# Patient Record
Sex: Male | Born: 1990 | Race: Black or African American | Hispanic: No | Marital: Married | Smoking: Current every day smoker
Health system: Southern US, Community
[De-identification: ages and names within clinical notes are randomized; demographics above are authoritative.]

---

## 2008-12-17 HISTORY — PX: KNEE SURGERY: SHX244

## 2014-08-03 ENCOUNTER — Encounter (HOSPITAL_COMMUNITY): Payer: Self-pay | Admitting: Emergency Medicine

## 2014-08-03 ENCOUNTER — Emergency Department (HOSPITAL_COMMUNITY)
Admission: EM | Admit: 2014-08-03 | Discharge: 2014-08-03 | Disposition: A | Discharge status: Home / Self Care | Payer: Self-pay | Attending: Emergency Medicine | Admitting: Emergency Medicine

## 2014-08-03 ENCOUNTER — Emergency Department (HOSPITAL_COMMUNITY): Payer: Self-pay

## 2014-08-03 DIAGNOSIS — S99919A Unspecified injury of unspecified ankle, initial encounter: Principal | ICD-10-CM

## 2014-08-03 DIAGNOSIS — X500XXA Overexertion from strenuous movement or load, initial encounter: Secondary | ICD-10-CM | POA: Insufficient documentation

## 2014-08-03 DIAGNOSIS — Y9289 Other specified places as the place of occurrence of the external cause: Secondary | ICD-10-CM | POA: Insufficient documentation

## 2014-08-03 DIAGNOSIS — S99929A Unspecified injury of unspecified foot, initial encounter: Principal | ICD-10-CM

## 2014-08-03 DIAGNOSIS — F17200 Nicotine dependence, unspecified, uncomplicated: Secondary | ICD-10-CM | POA: Insufficient documentation

## 2014-08-03 DIAGNOSIS — M25562 Pain in left knee: Secondary | ICD-10-CM

## 2014-08-03 DIAGNOSIS — S8990XA Unspecified injury of unspecified lower leg, initial encounter: Secondary | ICD-10-CM | POA: Insufficient documentation

## 2014-08-03 DIAGNOSIS — Y9389 Activity, other specified: Secondary | ICD-10-CM | POA: Insufficient documentation

## 2014-08-03 MED ORDER — NAPROXEN 375 MG PO TABS: 375.0000 mg | ORAL_TABLET | Freq: Two times a day (BID) | ORAL | Status: DC

## 2014-08-03 NOTE — Discharge Instructions (Signed)
Knee Bracing °Knee braces are supports to help stabilize and protect an injured or painful knee. They come in many different styles. They should support and protect the knee without increasing the chance of other injuries to yourself or others. It is important not to have a false sense of security when using a brace. Knee braces that help you to keep using your knee: °· Do not restore normal knee stability under high stress forces. °· May decrease some aspects of athletic performance. °Some of the different types of knee braces are: °· Prophylactic knee braces are designed to prevent or reduce the severity of knee injuries during sports that make injury to the knee more likely. °· Rehabilitative knee braces are designed to allow protected motion of: °¨ Injured knees. °¨ Knees that have been treated with or without surgery. °There is no evidence that the use of a supportive knee brace protects the graft following a successful anterior cruciate ligament (ACL) reconstruction. However, braces are sometimes used to:  °· Protect injured ligaments. °· Control knee movement during the initial healing period. °They may be used as part of the treatment program for the various injured ligaments or cartilage of the knee including the: °· Anterior cruciate ligament. °· Medial collateral ligament. °· Medial or lateral cartilage (meniscus). °· Posterior cruciate ligament. °· Lateral collateral ligament. °Rehabilitative knee braces are most commonly used: °· During crutch-assisted walking right after injury. °· During crutch-assisted walking right after surgery to repair the cartilage and/or cruciate ligament injury. °· For a short period of time, 2-8 weeks, after the injury or surgery. °The value of a rehabilitative brace as opposed to a cast or splint includes the: °· Ability to adjust the brace for swelling. °· Ability to remove the brace for examinations, icing, or showering. °· Ability to allow for movement in a controlled  range of motion. °Functional knee braces give support to knees that have already been injured. They are designed to provide stability for the injured knee and provide protection after repair. Functional knee braces may not affect performance much. Lower extremity muscle strengthening, flexibility, and improvement in technique are more important than bracing in treating ligamentous knee injuries. Functional braces are not a substitute for rehabilitation or surgical procedures. °Unloader/off-loader braces are designed to provide pain relief in arthritic knees. Patients with wear and tear arthritis from growing old or from an old cartilage injury (osteoarthritis) of the knee, and bowlegged (varus) or knock-knee (valgus) deformities, often develop increased pain in the arthritic side due to increased loading. Unloader/off-loader braces are made to reduce uneven loading in such knees. There is reduction in bowing out movement in bowlegged knees when the correct unloader brace is used. Patients with advanced osteoarthritis or severe varus or valgus alignment problems would not likely benefit from bracing. °Patellofemoral braces help the kneecap to move smoothly and well centered over the end of the femur in the knee.  °Most people who wear knee braces feel that they help. However, there is a lack of scientific evidence that knee braces are helpful at the level needed for athletic participation to prevent injury. In spite of this, athletes report an increase in knee stability, pain relief, performance improvement, and confidence during athletics when using a brace.  °Different knee problems require different knee braces: °· Your caregiver may suggest one kind of knee brace after knee surgery. °· A caregiver may choose another kind of knee brace for support instead of surgery for some types of torn ligaments. °· You may   also need one for pain in the front of your knee that is not getting better with strengthening and  flexibility exercises. °Get your caregiver's advice if you want to try a knee brace. The caregiver will advise you on where to get them and provide a prescription when it is needed to fashion and/or fit the brace. °Knee braces are the least important part of preventing knee injuries or getting better following injury. Stretching, strengthening and technique improvement are far more important in caring for and preventing knee injuries. When strengthening your knee, increase your activities a little at a time so as not to develop injuries from overuse. Work out an exercise plan with your caregiver and/or physical therapist to get the best program for you. Do not let a knee brace become a crutch. °Always remember, there are no braces which support the knee as well as your original ligaments and cartilage you were born with. Conditioning, proper warm-up, and stretching remain the most important parts of keeping your knees healthy. °HOW TO USE A KNEE BRACE °· During sports, knee braces should be used as directed by your caregiver. °· Make sure that the hinges are where the knee bends. °· Straps, tapes, or hook-and-loop tapes should be fastened around your leg as instructed. °· You should check the placement of the brace during activities to make sure that it has not moved. Poorly positioned braces can hurt rather than help you. °· To work well, a knee brace should be worn during all activities that put you at risk of knee injury. °· Warm up properly before beginning athletic activities. °HOME CARE INSTRUCTIONS °· Knee braces often get damaged during normal use. Replace worn-out braces for maximum benefit. °· Clean regularly with soap and water. °· Inspect your brace often for wear and tear. °· Cover exposed metal to protect others from injury. °· Durable materials may cost more, but last longer. °SEEK IMMEDIATE MEDICAL CARE IF:  °· Your knee seems to be getting worse rather than better. °· You have increasing pain or  swelling in the knee. °· You have problems caused by the knee brace. °· You have increased swelling or inflammation (redness or soreness) in your knee. °· Your knee becomes warm and more painful and you develop an unexplained temperature over 101°F (38.3°C). °MAKE SURE YOU:  °· Understand these instructions. °· Will watch your condition. °· Will get help right away if you are not doing well or get worse. °See your caregiver, physical therapist, or orthopedic surgeon for additional information. °Document Released: 02/23/2004 Document Revised: 04/19/2014 Document Reviewed: 06/01/2009 °[employer]® Patient Information ©2015 [employer]. This information is not intended to replace advice given to you by your health care provider. Make sure you discuss any questions you have with your health care provider. ° °Knee Pain °The knee is the complex joint between your thigh and your lower leg. It is made up of bones, tendons, ligaments, and cartilage. The bones that make up the knee are: °· The femur in the thigh. °· The tibia and fibula in the lower leg. °· The patella or kneecap riding in the groove on the lower femur. °CAUSES  °Knee pain is a common complaint with many causes. A few of these causes are: °· Injury, such as: °¨ A ruptured ligament or tendon injury. °¨ Torn cartilage. °· Medical conditions, such as: °¨ Gout °¨ Arthritis °¨ Infections °· Overuse, over training, or overdoing a physical activity. °Knee pain can be minor or severe. Knee pain can   accompany debilitating injury. Minor knee problems often respond well to self-care measures or get well on their own. More serious injuries may need medical intervention or even surgery. °SYMPTOMS °The knee is complex. Symptoms of knee problems can vary widely. Some of the problems are: °· Pain with movement and weight bearing. °· Swelling and tenderness. °· Buckling of the knee. °· Inability to straighten or extend your knee. °· Your knee locks and you cannot straighten  it. °· Warmth and redness with pain and fever. °· Deformity or dislocation of the kneecap. °DIAGNOSIS  °Determining what is wrong may be very straight forward such as when there is an injury. It can also be challenging because of the complexity of the knee. Tests to make a diagnosis may include: °· Your caregiver taking a history and doing a physical exam. °· Routine X-rays can be used to rule out other problems. X-rays will not reveal a cartilage tear. Some injuries of the knee can be diagnosed by: °¨ Arthroscopy a surgical technique by which a small video camera is inserted through tiny incisions on the sides of the knee. This procedure is used to examine and repair internal knee joint problems. Tiny instruments can be used during arthroscopy to repair the torn knee cartilage (meniscus). °¨ Arthrography is a radiology technique. A contrast liquid is directly injected into the knee joint. Internal structures of the knee joint then become visible on X-ray film. °¨ An MRI scan is a non X-ray radiology procedure in which magnetic fields and a computer produce two- or three-dimensional images of the inside of the knee. Cartilage tears are often visible using an MRI scanner. MRI scans have largely replaced arthrography in diagnosing cartilage tears of the knee. °· Blood work. °· Examination of the fluid that helps to lubricate the knee joint (synovial fluid). This is done by taking a sample out using a needle and a syringe. °TREATMENT °The treatment of knee problems depends on the cause. Some of these treatments are: °· Depending on the injury, proper casting, splinting, surgery, or physical therapy care will be needed. °· Give yourself adequate recovery time. Do not overuse your joints. If you begin to get sore during workout routines, back off. Slow down or do fewer repetitions. °· For repetitive activities such as cycling or running, maintain your strength and nutrition. °· Alternate muscle groups. For example, if  you are a weight lifter, work the upper body on one day and the lower body the next. °· Either tight or weak muscles do not give the proper support for your knee. Tight or weak muscles do not absorb the stress placed on the knee joint. Keep the muscles surrounding the knee strong. °· Take care of mechanical problems. °¨ If you have flat feet, orthotics or special shoes may help. See your caregiver if you need help. °¨ Arch supports, sometimes with wedges on the inner or outer aspect of the heel, can help. These can shift pressure away from the side of the knee most bothered by osteoarthritis. °¨ A brace called an "unloader" brace also may be used to help ease the pressure on the most arthritic side of the knee. °· If your caregiver has prescribed crutches, braces, wraps or ice, use as directed. The acronym for this is PRICE. This means protection, rest, ice, compression, and elevation. °· Nonsteroidal anti-inflammatory drugs (NSAIDs), can help relieve pain. But if taken immediately after an injury, they may actually increase swelling. Take NSAIDs with food in your stomach. Stop them   if you develop stomach problems. Do not take these if you have a history of ulcers, stomach pain, or bleeding from the bowel. Do not take without your caregiver's approval if you have problems with fluid retention, heart failure, or kidney problems. °· For ongoing knee problems, physical therapy may be helpful. °· Glucosamine and chondroitin are over-the-counter dietary supplements. Both may help relieve the pain of osteoarthritis in the knee. These medicines are different from the usual anti-inflammatory drugs. Glucosamine may decrease the rate of cartilage destruction. °· Injections of a corticosteroid drug into your knee joint may help reduce the symptoms of an arthritis flare-up. They may provide pain relief that lasts a few months. You may have to wait a few months between injections. The injections do have a small increased risk of  infection, water retention, and elevated blood sugar levels. °· Hyaluronic acid injected into damaged joints may ease pain and provide lubrication. These injections may work by reducing inflammation. A series of shots may give relief for as long as 6 months. °· Topical painkillers. Applying certain ointments to your skin may help relieve the pain and stiffness of osteoarthritis. Ask your pharmacist for suggestions. Many over the-counter products are approved for temporary relief of arthritis pain. °· In some countries, doctors often prescribe topical NSAIDs for relief of chronic conditions such as arthritis and tendinitis. A review of treatment with NSAID creams found that they worked as well as oral medications but without the serious side effects. °PREVENTION °· Maintain a healthy weight. Extra pounds put more strain on your joints. °· Get strong, stay limber. Weak muscles are a common cause of knee injuries. Stretching is important. Include flexibility exercises in your workouts. °· Be smart about exercise. If you have osteoarthritis, chronic knee pain or recurring injuries, you may need to change the way you exercise. This does not mean you have to stop being active. If your knees ache after jogging or playing basketball, consider switching to swimming, water aerobics, or other low-impact activities, at least for a few days a week. Sometimes limiting high-impact activities will provide relief. °· Make sure your shoes fit well. Choose footwear that is right for your sport. °· Protect your knees. Use the proper gear for knee-sensitive activities. Use kneepads when playing volleyball or laying carpet. Buckle your seat belt every time you drive. Most shattered kneecaps occur in car accidents. °· Rest when you are tired. °SEEK MEDICAL CARE IF:  °You have knee pain that is continual and does not seem to be getting better.  °SEEK IMMEDIATE MEDICAL CARE IF:  °Your knee joint feels hot to the touch and you have a high  fever. °MAKE SURE YOU:  °· Understand these instructions. °· Will watch your condition. °· Will get help right away if you are not doing well or get worse. °Document Released: 09/30/2007 Document Revised: 02/25/2012 Document Reviewed: 09/30/2007 °[employer]® Patient Information ©2015 [employer]. This information is not intended to replace advice given to you by your health care provider. Make sure you discuss any questions you have with your health care provider. ° °

## 2014-08-03 NOTE — ED Notes (Signed)
Left knee pain after stretching yesterday

## 2014-08-03 NOTE — ED Provider Notes (Signed)
CSN: [id number]     Arrival date & time 08/03/14  1102 History   First MD Initiated Contact with Patient 08/03/14 1145     Chief Complaint  Patient presents with  . Knee Pain     (Consider location/radiation/quality/duration/timing/severity/associated sxs/prior Treatment) HPI  Patient to the ER with complaints of left knee pain that started yesterday. He reports stretching his hip and then feeling his left knee pop and since then he has had pain. He is able to walk but with pain. He is wearing a knee sleeve from home and has taken Aleve. He has a history of MCL tear to his right knee and is concerned that he may have done so to the left side. Denies fall, head injury.  History reviewed. No pertinent past medical history. No past surgical history on file. No family history on file. History  Substance Use Topics  . Smoking status: Current Every Day Smoker  . Smokeless tobacco: Not on file  . Alcohol Use: Yes    Review of Systems   Review of Systems  Gen: no weight loss, fevers, chills, night sweats  Eyes: no occular draining, occular pain,  No visual changes  Nose: no epistaxis or rhinorrhea  Mouth: no dental pain, no sore throat  Neck: no neck pain  Lungs: No hemoptysis. No wheezing or coughing CV:  No palpitations, dependent edema or orthopnea. No chest pain Abd: no diarrhea. No nausea or vomiting, No abdominal pain  GU: no dysuria or gross hematuria  MSK:  No muscle weakness ,+ left knee pain Neuro: no headache, no focal neurologic deficits  Skin: no rash , no wounds Psyche: no complaints of depression or anxiety    Allergies  Review of patient's allergies indicates no known allergies.  Home Medications   Prior to Admission medications   Medication Sig Start Date End Date Taking? Authorizing Provider  naproxen (NAPROSYN) 375 MG tablet Take 1 tablet (375 mg total) by mouth 2 (two) times daily. 08/03/14   Berenise Hunton Irine Seal, PA-C   BP 123/82  Pulse 53  Temp(Src)  98.7 F (37.1 C) (Oral)  Resp 16  SpO2 100% Physical Exam  Nursing note and vitals reviewed. Constitutional: He appears well-developed and well-nourished. No distress.  HENT:  Head: Normocephalic and atraumatic.  Eyes: Pupils are equal, round, and reactive to light.  Neck: Normal range of motion. Neck supple.  Cardiovascular: Normal rate and regular rhythm.   Pulmonary/Chest: Effort normal.  Abdominal: Soft.  Musculoskeletal:       Left knee: He exhibits normal range of motion, no swelling, no effusion, no ecchymosis, no deformity, no laceration, no erythema, normal alignment, no LCL laxity, normal patellar mobility, no bony tenderness, normal meniscus and no MCL laxity. Tenderness found. Medial joint line tenderness noted.  Neurological: He is alert.  Skin: Skin is warm and dry.    ED Course  Procedures (including critical care time) Labs Review Labs Reviewed - No data to display  Imaging Review Dg Knee Complete 4 Views Left  08/03/2014   CLINICAL DATA:  Basketball injury and knee swelling.  EXAM: LEFT KNEE - COMPLETE 4+ VIEW  COMPARISON:  None.  FINDINGS: Negative for fracture or dislocation. No significant joint effusion. Alignment of the left knee is normal. No significant degenerative disease.  IMPRESSION: No acute bone abnormality in the left knee.   Electronically Signed   By: Richarda Overlie M.D.   On: 08/03/2014 12:35     EKG Interpretation None  MDM   Final diagnoses:  Knee pain, acute, left    pts xray does not show any acute abnormalities. He may have sprained/strained his knee but no acute findings that require emergent intervention. Recommend NSAIDs, ice and to continue wearing brace. Referral to Ortho given.   23 y.o.Jose Cummings's evaluation in the Emergency Department is complete. It has been determined that no acute conditions requiring further emergency intervention are present at this time. The patient/guardian have been advised of the diagnosis and  plan. We have discussed signs and symptoms that warrant return to the ED, such as changes or worsening in symptoms.  Vital signs are stable at discharge. Filed Vitals:   08/03/14 1115  BP: 123/82  Pulse: 53  Temp: 98.7 F (37.1 C)  Resp: 16    Patient/guardian has voiced understanding and agreed to follow-up with the PCP or specialist.   Dorthula Matasiffany G Aidynn Polendo, PA-C 08/03/14 1248

## 2014-08-10 NOTE — ED Provider Notes (Signed)
Medical screening examination/treatment/procedure(s) were performed by non-physician practitioner and as supervising physician I was immediately available for consultation/collaboration.   EKG Interpretation None        Vitaly Wanat, MD 08/10/14 0709 

## 2014-08-15 ENCOUNTER — Emergency Department (HOSPITAL_COMMUNITY): Payer: Self-pay

## 2014-08-15 ENCOUNTER — Emergency Department (HOSPITAL_COMMUNITY)
Admission: EM | Admit: 2014-08-15 | Discharge: 2014-08-15 | Disposition: A | Discharge status: Home / Self Care | Payer: Self-pay | Attending: Emergency Medicine | Admitting: Emergency Medicine

## 2014-08-15 ENCOUNTER — Encounter (HOSPITAL_COMMUNITY): Payer: Self-pay | Admitting: Emergency Medicine

## 2014-08-15 DIAGNOSIS — R079 Chest pain, unspecified: Secondary | ICD-10-CM | POA: Insufficient documentation

## 2014-08-15 DIAGNOSIS — F17200 Nicotine dependence, unspecified, uncomplicated: Secondary | ICD-10-CM | POA: Insufficient documentation

## 2014-08-15 DIAGNOSIS — R0789 Other chest pain: Secondary | ICD-10-CM | POA: Insufficient documentation

## 2014-08-15 LAB — CBC WITH DIFFERENTIAL/PLATELET
BASOS ABS: 0 10*3/uL (ref 0.0–0.1)
Basophils Relative: 1 % (ref 0–1)
EOS PCT: 1 % (ref 0–5)
Eosinophils Absolute: 0 10*3/uL (ref 0.0–0.7)
HCT: 44.4 % (ref 39.0–52.0)
Hemoglobin: 15.3 g/dL (ref 13.0–17.0)
LYMPHS PCT: 20 % (ref 12–46)
Lymphs Abs: 1.2 10*3/uL (ref 0.7–4.0)
MCH: 31.2 pg (ref 26.0–34.0)
MCHC: 34.5 g/dL (ref 30.0–36.0)
MCV: 90.6 fL (ref 78.0–100.0)
Monocytes Absolute: 0.4 10*3/uL (ref 0.1–1.0)
Monocytes Relative: 7 % (ref 3–12)
NEUTROS ABS: 4.2 10*3/uL (ref 1.7–7.7)
Neutrophils Relative %: 71 % (ref 43–77)
Platelets: 169 10*3/uL (ref 150–400)
RBC: 4.9 MIL/uL (ref 4.22–5.81)
RDW: 12.1 % (ref 11.5–15.5)
WBC: 5.8 10*3/uL (ref 4.0–10.5)

## 2014-08-15 LAB — BASIC METABOLIC PANEL
ANION GAP: 11 (ref 5–15)
BUN: 14 mg/dL (ref 6–23)
CALCIUM: 9.2 mg/dL (ref 8.4–10.5)
CHLORIDE: 101 meq/L (ref 96–112)
CO2: 26 mEq/L (ref 19–32)
CREATININE: 1.14 mg/dL (ref 0.50–1.35)
GFR calc Af Amer: >90 mL/min (ref >90)
GFR calc non Af Amer: 88 mL/min — ABNORMAL LOW (ref >90)
Glucose, Bld: 93 mg/dL (ref 70–99)
Potassium: 5.1 mEq/L (ref 3.7–5.3)
SODIUM: 138 meq/L (ref 137–147)

## 2014-08-15 LAB — I-STAT TROPONIN, ED
Troponin i, poc: 0 ng/mL (ref 0.00–0.08)
Troponin i, poc: 0 ng/mL (ref 0.00–0.08)

## 2014-08-15 NOTE — Discharge Instructions (Signed)

## 2014-08-15 NOTE — ED Provider Notes (Signed)
CSN: [id number]     Arrival date & time 08/15/14  0806 History   First MD Initiated Contact with Patient 08/15/14 0815     Chief Complaint  Patient presents with  . Chest Pain     (Consider location/radiation/quality/duration/timing/severity/associated sxs/prior Treatment) HPI Comments: Patient is a 23 year old male who presents the emergency department for evaluation of chest pain. He reports that he woke up with a sharp type chest pain. He went outside to smoke a joint which exacerbated his pain. At that time he felt a tightness in his chest. He rated his pain at that point as a 5/10. At its worst the pain was a 10 out of 10. He had no associated shortness of breath, diaphoresis, lightheadedness. He received 324 mg of aspirin and 2 nitroglycerin tablets per EMS. His pain is significantly improved. He denies any leg swelling, pleuritic pain. No prior history of DVT or PE. No recent surgeries. No long trips, hemoptysis. No drug use other than marijuana.   The history is provided by the patient. No language interpreter was used.    History reviewed. No pertinent past medical history. Past Surgical History  Procedure Laterality Date  . Knee surgery     History reviewed. No pertinent family history. History  Substance Use Topics  . Smoking status: Current Some Day Smoker  . Smokeless tobacco: Not on file  . Alcohol Use: Yes     Comment: occasionally    Review of Systems  Constitutional: Negative for fever and chills.  Respiratory: Positive for chest tightness. Negative for shortness of breath.   Cardiovascular: Positive for chest pain. Negative for palpitations and leg swelling.  Gastrointestinal: Negative for nausea, vomiting and abdominal pain.  Skin: Negative for rash.  Neurological: Negative for dizziness and light-headedness.  All other systems reviewed and are negative.     Allergies  Review of patient's allergies indicates no known allergies.  Home Medications    Prior to Admission medications   Medication Sig Start Date End Date Taking? Authorizing Provider  naproxen sodium (ALEVE) 220 MG tablet Take 220-440 mg by mouth 2 (two) times daily as needed (for pain).    Yes Historical Provider, MD   BP 122/51  Pulse 60  Temp(Src) 98.8 F (37.1 C) (Oral)  Resp 20  Ht  (1.981 m)  Wt 225 lb (102.059 kg)  BMI 26.01 kg/m2  SpO2 100% Physical Exam  Nursing note and vitals reviewed. Constitutional: He is oriented to person, place, and time. He appears well-developed and well-nourished. He does not appear ill. No distress.  NAD  HENT:  Head: Normocephalic and atraumatic.  Right Ear: External ear normal.  Left Ear: External ear normal.  Nose: Nose normal.  Mouth/Throat: Oropharynx is clear and moist.  Eyes: Conjunctivae are normal.  Neck: Normal range of motion. Neck supple. No tracheal deviation present.  Cardiovascular: Normal rate, regular rhythm, normal heart sounds, intact distal pulses and normal pulses.   Pulses:      Radial pulses are 2+ on the right side, and 2+ on the left side.       Posterior tibial pulses are 2+ on the right side, and 2+ on the left side.  Pulmonary/Chest: Effort normal and breath sounds normal. No stridor.  Abdominal: Soft. He exhibits no distension. There is no tenderness.  Musculoskeletal: Normal range of motion.  Neurological: He is alert and oriented to person, place, and time.  Skin: Skin is warm and dry. He is not diaphoretic.  Psychiatric: He has  a normal mood and affect. His behavior is normal.    ED Course  Procedures (including critical care time) Labs Review Labs Reviewed  BASIC METABOLIC PANEL - Abnormal; Notable for the following:    GFR calc non Af Amer 88 (*)    All other components within normal limits  CBC WITH DIFFERENTIAL  I-STAT TROPOININ, ED  Rosezena Sensor, ED    Imaging Review Dg Chest 2 View  08/15/2014   CLINICAL DATA:  Chest pain  EXAM: CHEST  2 VIEW  COMPARISON:  None.   FINDINGS: The heart size and mediastinal contours are within normal limits. Both lungs are clear. The visualized skeletal structures are unremarkable.  IMPRESSION: No active cardiopulmonary disease.   Electronically Signed   By: Alcide Clever M.D.   On: 08/15/2014 09:37     EKG Interpretation   Date/Time:  Sunday August 15 2014 08:10:02 EDT Ventricular Rate:  74 PR Interval:  142 QRS Duration: 95 QT Interval:  361 QTC Calculation: 400 R Axis:   123 Text Interpretation:  Sinus rhythm ST elev, probable normal early repol  pattern Normal intervals No acute findings Confirmed by Rhunette Croft, MD,  ANKIT [phone]) on 08/15/2014 10:13:44 AM      MDM   Final diagnoses:  Chest pain, unspecified chest pain type    Patient is to be discharged with recommendation to follow up with PCP in regards to today's hospital visit. Chest pain is not likely of cardiac or pulmonary etiology d/t presentation, perc negative, VSS, no tracheal deviation, no JVD or new murmur, RRR, breath sounds equal bilaterally, EKG without acute abnormalities, negative troponin, and negative CXR. Pt has been advised to return to the ED is CP becomes exertional, associated with diaphoresis or nausea, radiates to left jaw/arm, worsens or becomes concerning in any way. Pt appears reliable for follow up and is agreeable to discharge.       Mora Bellman, PA-C 08/15/14 1544

## 2014-08-15 NOTE — ED Notes (Signed)
Per [facility], pt woke up with chest tightness and pressure. He went outside to smoke some marijuana and the pain got worse. Pt said the pain reached a 10/10. He received 324 of aspirin and 2 nitroglycerin tablets and the pain is now at a 5/10. Pt denies any dizziness or shortness of breath. No acute distress noted at this time.

## 2014-08-21 NOTE — ED Provider Notes (Signed)
Medical screening examination/treatment/procedure(s) were performed by non-physician practitioner and as supervising physician I was immediately available for consultation/collaboration.   EKG Interpretation   Date/Time:  Sunday August 15 2014 08:10:02 EDT Ventricular Rate:  74 PR Interval:  142 QRS Duration: 95 QT Interval:  361 QTC Calculation: 400 R Axis:   123 Text Interpretation:  Sinus rhythm ST elev, probable normal early repol  pattern Normal intervals No acute findings Confirmed by Rhunette Croft, MD,  Brittni Hult ([id number]) on 08/15/2014 10:13:44 AM       Derwood Kaplan, MD 08/21/14 1610

## 2014-08-24 ENCOUNTER — Encounter (HOSPITAL_COMMUNITY): Payer: Self-pay | Admitting: Emergency Medicine

## 2014-08-24 ENCOUNTER — Emergency Department (HOSPITAL_COMMUNITY)
Admission: EM | Admit: 2014-08-24 | Discharge: 2014-08-25 | Disposition: A | Discharge status: Home / Self Care | Payer: Self-pay | Attending: Emergency Medicine | Admitting: Emergency Medicine

## 2014-08-24 DIAGNOSIS — R0789 Other chest pain: Secondary | ICD-10-CM | POA: Insufficient documentation

## 2014-08-24 DIAGNOSIS — R0602 Shortness of breath: Secondary | ICD-10-CM | POA: Insufficient documentation

## 2014-08-24 DIAGNOSIS — F17200 Nicotine dependence, unspecified, uncomplicated: Secondary | ICD-10-CM | POA: Insufficient documentation

## 2014-08-24 DIAGNOSIS — R079 Chest pain, unspecified: Secondary | ICD-10-CM | POA: Insufficient documentation

## 2014-08-24 LAB — I-STAT CHEM 8, ED
BUN: 19 mg/dL (ref 6–23)
Calcium, Ion: 1.13 mmol/L (ref 1.12–1.23)
Chloride: 102 mEq/L (ref 96–112)
Creatinine, Ser: 1.4 mg/dL — ABNORMAL HIGH (ref 0.50–1.35)
Glucose, Bld: 90 mg/dL (ref 70–99)
HCT: 43 % (ref 39.0–52.0)
Hemoglobin: 14.6 g/dL (ref 13.0–17.0)
POTASSIUM: 3.5 meq/L — AB (ref 3.7–5.3)
SODIUM: 140 meq/L (ref 137–147)
TCO2: 27 mmol/L (ref 0–100)

## 2014-08-24 LAB — I-STAT TROPONIN, ED: Troponin i, poc: 0 ng/mL (ref 0.00–0.08)

## 2014-08-24 NOTE — ED Notes (Signed)
Pt presents to ED via EMS with c/o chest pain, describes as tightness, onset around 1800 tonight. Pt states he was playing basketball when he felt fainted/dizzy, added that he felt shaky if he was having panic attacks. He reports he gets anxious at times and xanax seems to help. Per EMS, BP-136/70, HR-70-88 norma sinus, RR-16, SpO2-100% on room air, CBG-102, given  ASA, sublingual nitro x2, 18G left A/C line placed by EMS.

## 2014-08-24 NOTE — ED Provider Notes (Signed)
CSN: [id number]     Arrival date & time 08/24/14  2251 History   First MD Initiated Contact with Patient 08/24/14 2311     Chief Complaint  Patient presents with  . Chest Pain     (Consider location/radiation/quality/duration/timing/severity/associated sxs/prior Treatment) HPI Complains of anterior chest pain nonradiating sharp onset while playing basketball tonight 8 PM accompanied by mild shortness of breath. He was brought by EMS. EMS treated patient with sublingual nitroglycerin x2 tablets and 4 baby aspirins with partial relief. Patient also treated himself with the Xanax tablet which he bought off the street prior to coming here. Presently he is asymptomatic. Patient was also seen here for chest pain onset 08/15/2014. At that time chest pain had awakened him from sleep. He states on 08/15/2014 his pain was much more severe than today. Patient reports he regularly plays basketball and exercises vigorously, without any chest discomfort. History reviewed. No pertinent past medical history. Past Surgical History  Procedure Laterality Date  . Knee surgery     cardiac risk factors smoker otherwise negative History reviewed. No pertinent family history. History  Substance Use Topics  . Smoking status: Current Some Day Smoker -- 0.10 packs/day    Types: Cigarettes  . Smokeless tobacco: Not on file  . Alcohol Use: Yes     Comment: occasionally    marijuana use. No other illicit drug use Review of Systems  Constitutional: Negative.   HENT: Negative.   Respiratory: Positive for shortness of breath.   Cardiovascular: Positive for chest pain.  Gastrointestinal: Negative.   Musculoskeletal: Negative.   Skin: Negative.   Neurological: Negative.   Psychiatric/Behavioral: Negative.   All other systems reviewed and are negative.     Allergies  Review of patient's allergies indicates no known allergies.  Home Medications   Prior to Admission medications   Medication Sig Start Date  End Date Taking? Authorizing Provider  naproxen sodium (ALEVE) 220 MG tablet Take 220-440 mg by mouth 2 (two) times daily as needed (for pain).     Historical Provider, MD   BP 137/72  Pulse 61  Temp(Src) 98.8 F (37.1 C) (Oral)  Resp 22  SpO2 99% Physical Exam  Nursing note and vitals reviewed. Constitutional: He appears well-developed and well-nourished.  HENT:  Head: Normocephalic and atraumatic.  Eyes: Conjunctivae are normal. Pupils are equal, round, and reactive to light.  Neck: Neck supple. No tracheal deviation present. No thyromegaly present.  Cardiovascular: Normal rate and regular rhythm.   No murmur heard. Pulmonary/Chest: Effort normal and breath sounds normal.  Abdominal: Soft. Bowel sounds are normal. He exhibits no distension. There is no tenderness.  Musculoskeletal: Normal range of motion. He exhibits no edema and no tenderness.  Neurological: He is alert. Coordination normal.  Skin: Skin is warm and dry. No rash noted.  Psychiatric: He has a normal mood and affect.    ED Course  Procedures (including critical care time) Labs Review Labs Reviewed  I-STAT TROPOININ, ED  I-STAT CHEM 8, ED    Imaging Review No results found.   EKG Interpretation   Date/Time:  Tuesday August 24 2014 22:50:31 EDT Ventricular Rate:  72 PR Interval:  173 QRS Duration: 100 QT Interval:  378 QTC Calculation: 414 R Axis:   68 Text Interpretation:  Age not entered, assumed to be  23 years old for  purpose of ECG interpretation Sinus rhythm Probable left ventricular  hypertrophy ST elev, probable normal early repol pattern No significant  change since last tracing Confirmed  by Ethelda Chick  MD, Doreatha Martin [phone]) on  08/24/2014 11:25:16 PM     Chest x-ray viewed by me Results for orders placed during the hospital encounter of 08/24/14  I-STAT TROPOININ, ED      Result Value Ref Range   Troponin i, poc 0.00  0.00 - 0.08 ng/mL   Comment 3           I-STAT CHEM 8, ED       Result Value Ref Range   Sodium 140  137 - 147 mEq/L   Potassium 3.5 (*) 3.7 - 5.3 mEq/L   Chloride 102  96 - 112 mEq/L   BUN 19  6 - 23 mg/dL   Creatinine, Ser 6.04 (*) 0.50 - 1.35 mg/dL   Glucose, Bld 90  70 - 99 mg/dL   Calcium, Ion 5.40  9.81 - 1.23 mmol/L   TCO2 27  0 - 100 mmol/L   Hemoglobin 14.6  13.0 - 17.0 g/dL   HCT 19.1  47.8 - 29.5 %  I-STAT TROPOININ, ED      Result Value Ref Range   Troponin i, poc 0.00  0.00 - 0.08 ng/mL   Comment 3            Dg Chest 2 View  08/25/2014   CLINICAL DATA:  Shortness of breath, chest pain  EXAM: CHEST  2 VIEW  COMPARISON:  08/15/2014  FINDINGS: Lungs are clear.  No pleural effusion or pneumothorax.  The heart is normal in size.  Visualized osseous structures are within normal limits.  IMPRESSION: No evidence of acute cardiopulmonary disease.   Electronically Signed   By: Charline Bills M.D.   On: 08/25/2014 00:13   Dg Chest 2 View  08/15/2014   CLINICAL DATA:  Chest pain  EXAM: CHEST  2 VIEW  COMPARISON:  None.  FINDINGS: The heart size and mediastinal contours are within normal limits. Both lungs are clear. The visualized skeletal structures are unremarkable.  IMPRESSION: No active cardiopulmonary disease.   Electronically Signed   By: Alcide Clever M.D.   On: 08/15/2014 09:37   Dg Knee Complete 4 Views Left  08/03/2014   CLINICAL DATA:  Basketball injury and knee swelling.  EXAM: LEFT KNEE - COMPLETE 4+ VIEW  COMPARISON:  None.  FINDINGS: Negative for fracture or dislocation. No significant joint effusion. Alignment of the left knee is normal. No significant degenerative disease.  IMPRESSION: No acute bone abnormality in the left knee.   Electronically Signed   By: Richarda Overlie M.D.   On: 08/03/2014 12:35   4aM  Pt remains asymptomatic  MDM  PERC neg heart score equals 2 based on risk factors and history Final diagnoses:  None  councilled pt for 5 minutes on smoking cessation Dx#1 atypical chest pain #2 tobacco abuse Plan outpatient  cardiac evaluation. Referral [facility]    Doug Sou, MD 08/25/14 6213

## 2014-08-25 ENCOUNTER — Emergency Department (HOSPITAL_COMMUNITY): Payer: Self-pay

## 2014-08-25 LAB — I-STAT TROPONIN, ED: Troponin i, poc: 0 ng/mL (ref 0.00–0.08)

## 2014-08-25 NOTE — Discharge Instructions (Signed)
Chest Pain (Nonspecific) Call the cardiology office today to arrange the next available appointment for further evaluation of your chest pain. Stop smoking cigarettes and marijuana as they cause heart and lung problems. You can call any of the numbers on the resource guide to get a primary care physician It is often hard to give a diagnosis for the cause of chest pain. There is always a chance that your pain could be related to something serious, such as a heart attack or a blood clot in the lungs. You need to follow up with your doctor. HOME CARE  If antibiotic medicine was given, take it as directed by your doctor. Finish the medicine even if you start to feel better.  For the next few days, avoid activities that bring on chest pain. Continue physical activities as told by your doctor.  Do not use any tobacco products. This includes cigarettes, chewing tobacco, and e-cigarettes.  Avoid drinking alcohol.  Only take medicine as told by your doctor.  Follow your doctor's suggestions for more testing if your chest pain does not go away.  Keep all doctor visits you made. GET HELP IF:  Your chest pain does not go away, even after treatment.  You have a rash with blisters on your chest.  You have a fever. GET HELP RIGHT AWAY IF:   You have more pain or pain that spreads to your arm, neck, jaw, back, or belly (abdomen).  You have shortness of breath.  You cough more than usual or cough up blood.  You have very bad back or belly pain.  You feel sick to your stomach (nauseous) or throw up (vomit).  You have very bad weakness.  You pass out (faint).  You have chills. This is an emergency. Do not wait to see if the problems will go away. Call your local emergency services (911 in U.S[place] Do not drive yourself to the hospital. MAKE SURE YOU:   Understand these instructions.  Will watch your condition.  Will get help right away if you are not doing well or get worse. Document  Released: 05/21/2008 Document Revised: 12/08/2013 Document Reviewed: 05/21/2008 [facility] Patient Information 2015 [place], [place]. This information is not intended to replace advice given to you by your health care provider. Make sure you discuss any questions you have with your health care provider.  Emergency Department Resource Guide 1) Find a Doctor and Pay Out of Pocket Although you won't have to find out who is covered by your insurance plan, it is a good idea to ask around and get recommendations. You will then need to call the office and see if the doctor you have chosen will accept you as a new patient and what types of options they offer for patients who are self-pay. Some doctors offer discounts or will set up payment plans for their patients who do not have insurance, but you will need to ask so you aren't surprised when you get to your appointment.  2) Contact Your Local Health Department Not all health departments have doctors that can see patients for sick visits, but many do, so it is worth a call to see if yours does. If you don't know where your local health department is, you can check in your phone book. The CDC also has a tool to help you locate your state's health department, and many state websites also have listings of all of their local health departments.  3) Find a Walk-in Clinic If your illness is not  likely to be very severe or complicated, you may want to try a walk in clinic. These are popping up all over the country in pharmacies, drugstores, and shopping centers. They're usually staffed by nurse practitioners or physician assistants that have been trained to treat common illnesses and complaints. They're usually fairly quick and inexpensive. However, if you have serious medical issues or chronic medical problems, these are probably not your best option.  No Primary Care Doctor: - Call Health Connect at  [phone] - they can help you locate a primary care doctor that   accepts your insurance, provides certain services, etc. - Physician Referral Service- [phone]  Chronic Pain Problems: Organization         Address  Phone   Notes  [facility]  [phone] Patients need to be referred by their primary care doctor.   Medication Assistance: Organization         Address  Phone   Notes  [facility] Medication [facility] [address] [place], [place] [address] [phone] --Must be a resident of [facility] -- Must have NO insurance coverage whatsoever (no Medicaid/ Medicare, etc.) -- The pt. MUST have a primary care doctor that directs their care regularly and follows them in the community   [facility]  [phone]   [employer]  [phone]    Agencies that provide inexpensive medical care: Organization         Address  Phone   Notes  [facility]  [phone]   [facility]    [phone]   [facility] [address] [place], [place] [address] [phone]   [facility] [address], [place] [phone]   Planned Parenthood    [phone]   [facility]    [phone]   [facility]  [address], [place] Phone:  [phone], Fax:  [phone] Hours of Operation:  9 am - 6 pm, M-F.  Also accepts Medicaid/Medicare and self-pay.  [facility]  [address], [place] Phone: [phone], Fax: [phone]. Hours of Operation:  8:30 am - 5:30 pm, M-F.  Also accepts Medicaid and self-pay.  [facility] [place] [address], [place] Phone: [phone]   [facility] [address] Natasha Bence [place], [place] [phone], Ext. [phone] Mondays & Thursdays: 7-9 AM.  First 15 patients are seen on a first come, first serve basis.    Medicaid-accepting [facility] Providers:  Organization          Address  Phone   Notes  [facility] [address], [place] [phone] Also accepts self-pay patients.  [facility] [address] Laurell Josephs [place], [place]  [phone]   [facility] [address], [place] [phone]   [facility] [address], [place] [phone]   Renaye Rakers [address], [place]   [phone] Only accepts [place] Access [place] patients after they have their name applied to their card.   Self-Pay (no insurance) in [facility]:  Organization         Address  Phone   Notes  Sickle Cell Patients, [facility] Internal Medicine [address] [place], [place] [phone]   Redge Gainer  [facility] [address] [place], [place] [phone]   [facility] [place]  [address], [place] [phone]   [facility]/Dr. Osei-Bonsu  [address] [place] or [address], [place] [phone] Phone number for both [place] and [place] locations is the same.  [facility] [address], [employer] [phone]   [facility] [address], [place] or [address] [phone]   [facility] [address] [place] [phone], phone; [phone], fax Sees patients 1st and 3rd Saturday of every month.  Must not qualify for public or private insurance (i.e. Medicaid, Medicare, [place] Health Choice, Veterans' Benefits)  Household income should be no more than 200% of the poverty level The clinic cannot treat you if you are pregnant or think you are pregnant  Sexually transmitted diseases are not treated at the clinic.    Dental Care: Organization         Address  Phone  Notes  [facility] Department of [facility] [address] [place],  [place] [phone] Accepts children up to age 65 who are enrolled in [place] or [place] Health Choice; pregnant women with a Medicaid card; and children who have applied for Medicaid or [place] Health Choice, but were declined, whose parents can pay a reduced fee at time of service.  [facility] Department of [facility]  [address] [place] [address], [place] [phone] Accepts children up to age 17 who are enrolled in [place] or  Health Choice; pregnant women with a Medicaid card; and children who have applied for Medicaid or  Health Choice, but were declined, whose parents can pay a reduced fee at time of service.  [facility]  [address] [place], [place] [phone] Patients are seen by appointment only. Walk-ins are not accepted. [facility] will see patients 61 years of age and older. Monday - Tuesday (8am-5pm) Most Wednesdays (8:30-5pm) $30 per visit, cash only  [facility] Access [facility]  [address], [facility] [phone] Patients are seen by appointment only. Walk-ins are not accepted. [facility] will see patients 10 years of age and older. One Wednesday Evening (Monthly: Volunteer Based).  $30 per visit, cash only  [employer]  [phone] for adults; Children under age 22, call Graduate Pediatric Dentistry at [phone]. Children aged 82-14, please call [phone] to request a pediatric application.  Dental services are provided in all areas of dental care including fillings, crowns and bridges, complete and partial dentures, implants, gum treatment, root canals, and extractions. Preventive care is also provided. Treatment is provided to both adults and children. Patients are selected via a lottery and there is often a waiting list.   [employer] [address] [place]  [phone] [email or web address]   [facility] [address] [place], [place]  [phone], Ext. [phone] Second and Fourth Thursday of each month, opens at 6:30 AM; Clinic ends at 9 AM.  Patients are seen on a first-come first-served basis, and a limited number are seen during each clinic.   [facility]  [address] Ether Griffins [place], [place] [phone]   Eligibility Requirements You must have lived in [place], [place], or [place] counties for at least the last three  months.   You cannot be eligible for state or federal sponsored [place], including [employer], [place], or [employer].   You generally cannot be eligible for healthcare insurance through your employer.    How to apply: Eligibility screenings are held every Tuesday and Wednesday afternoon from 1:00 pm until 4:00 pm. You do not need an appointment for the interview!  [facility] [address], [place], [place] [phone]   [facility] Health Department  [phone]   [facility] Health Department  [phone]   [facility] Health Department  [phone]    Behavioral Health Resources in the Community: Intensive Outpatient Programs Organization         Address  Phone  Notes  [facility] Services [address] [place], [place] [phone]   [facility] Outpatient [address], [place], [place] [phone]   [facility] [address], [place], [place]  [phone]   [facility] Mental Health [address],  [place], [place] [phone] or [phone]   Substance Abuse Resources Organization         Address  Phone  Notes  [facility]  [phone]   [facility]  [phone]   [facility] [place]  [phone]   Floydene Flock  [phone]   Residential & Outpatient Substance Abuse Program  [phone]   Psychological Services Organization         Address  Phone  Notes  [facility] Behavioral Health  [phone]   [facility]  [phone]    [facility] Mental Health [address] [place] [phone] or [phone]    Mobile Crisis Teams Organization         Address  Phone  Notes  [facility], Mobile Crisis Care Unit  [phone]   [facility]  [address] [place], [place] [phone]   Doristine Locks [address] [place] [phone]    Self-Help/Support Groups Organization         Address  Phone             Notes  [facility] [place] - variety of support groups  [phone]- [phone] Call for more information  Narcotics Anonymous (NA), [facility] [address] [place] [address], [employer] [place]  2 meetings at this location   Statistician         Address  Phone  Notes  [facility] [address],    [place]  [phone]   [facility]  [address] [place] [address], [place], [place] [phone]   [facility] [address] [place], [place] [phone] Admissions: 8am-3pm M-F  [facility] [address],    [place], [place] [phone]   [facility] [address] [place], [place], [place] [phone]   [facility] [address],  [place], [place] [phone]   [facility] - Intensive Outpatient [address] Laurell Joseph[address], [place], [place] [phone]   [facility] ([facility] [address] [place]  [place], [place] [phone] or [phone]   [facility] ([facility]) [address], [place], [place] [phone] Accepts Medicaid  [facility] [address],  [place] [phone] Substance Abuse/Addiction Treatment   [facility] Organization         Address  Phone  Notes  [facility]  [phone]   Angie Fava, PhD  [address] [place]   [phone] or [phone]   [facility]   [address] [place], [place] [phone]   [facility] [address] [place], [place] [phone] Insurance/Medicaid/sponsorship through [facility] and Families [address]                                    [place], [place] [phone] Therapy/tele-psych/case  [facility] [address]   [place], [place] [phone]    Dr. Lolly Mustache  [phone]   [facility]  [employer] [facility] Dept. 1) [address] [place] 2) [address], [place] 3)  [address], [place] [phone]  [phone]   [facility] Child Abuse Hotline [phone] or 8671586505 (After Hours)

## 2014-09-02 ENCOUNTER — Encounter (HOSPITAL_COMMUNITY): Payer: Self-pay | Admitting: Emergency Medicine

## 2014-09-02 ENCOUNTER — Emergency Department (INDEPENDENT_AMBULATORY_CARE_PROVIDER_SITE_OTHER)
Admission: EM | Admit: 2014-09-02 | Discharge: 2014-09-02 | Disposition: A | Discharge status: Home / Self Care | Payer: Self-pay | Source: Home / Self Care | Attending: Emergency Medicine | Admitting: Emergency Medicine

## 2014-09-02 DIAGNOSIS — R079 Chest pain, unspecified: Secondary | ICD-10-CM

## 2014-09-02 MED ORDER — HYDROXYZINE HCL 25 MG PO TABS: 25.0000 mg | ORAL_TABLET | Freq: Four times a day (QID) | ORAL | Status: DC | PRN

## 2014-09-02 NOTE — Discharge Instructions (Signed)

## 2014-09-02 NOTE — ED Provider Notes (Signed)
Chief Complaint   Follow-up   History of Present Illness    Jose Cummings is a 23 year old male who has had episodes of chest pain intermittently throughout the past 3 weeks. He's actually only had 2 episodes of chest discomfort. One of these occurred while playing basketball, the other while he was sitting. The pain begins rather suddenly in the substernal area with radiation towards the right chest. It's described as a pressure rated 8/10 in intensity. Right now he has no pain in there is no radiation to the neck, the back, or into the left arm. He denies any associated shortness of breath, nausea, or diaphoresis, but he has had some associated palpitations, nervousness, and tremulousness. On both occasions he went to the emergency room with the chest pain. On both occasions he had completely normal EKGs with the exception of benign early repolarization with normal enzymes. Myocardial infarction was ruled out. He was referred to see Dr. Charlton Haws for further evaluation. He has an appointment there on October 8. He denies any chest pain in the past 2 or 3 days. He has had no exertional chest pain. He denies fever, chills, coughing, wheezing, shortness of breath, palpitations, presyncope, or syncope. He occasionally has some epigastric pain but denies any indigestion, heartburn, nausea, or vomiting.  Review of Systems    Other than noted above, the patient denies any of the following symptoms. Systemic:  No fever or chills. Pulmonary:  No cough, wheezing, shortness of breath, sputum production, hemoptysis. Cardiac:  No palpitations, rapid heartbeat, dizziness, presyncope or syncope. GI:  No abdominal pain, heartburn, nausea, or vomiting. Ext:  No leg pain or swelling.  PMFSH    Past medical history, family history, social history, meds, and allergies were reviewed.   Physical Exam     Vital signs:  BP 122/67  Pulse 66  Temp(Src) 98.6 F (37 C) (Oral)  Resp 14  SpO2 100% Gen:   Alert, oriented, in no distress, skin warm and dry. ENT:  Mucous membranes moist, pharynx clear. Neck:  Supple, no adenopathy or tenderness.  No JVD. Lungs:  Clear to auscultation, no wheezes, rales or rhonchi.  No respiratory distress. Heart:  Regular rhythm.  No gallops, murmers, clicks or rubs. Chest:  No chest wall tenderness. Abdomen:  Soft, nontender, no organomegaly or mass.  Bowel sounds normal.  No pulsatile abdominal mass or bruit. Ext:  No edema.  No calf tenderness and Homann's sign negative.  Pulses full and equal. Skin:  Warm and dry.  No rash.                                                                                                                        Assessment     The encounter diagnosis was Chest pain, unspecified chest pain type.  This is unlikely to be acute coronary syndrome, pulmonary embolism, ruptured aneurysm, pneumothorax, Boerhaave syndrome, pericarditis, musculoskeletal pain, or reflux esophagitis. Since he's already had 2 emergency room workups and does not  have any chest pain right now, I did not feel that further workup here would be fruitful. He does need to see a cardiologist for followup. I don't think this is going to turn out to be chest pain due to cardiac causes. Most likely cause is anxiety. Other possibilities would include reflux or musculoskeletal pain.  Plan     1.  Meds:  The following meds were prescribed:   Discharge Medication List as of 09/02/2014  9:56 AM    START taking these medications   Details  hydrOXYzine (ATARAX/VISTARIL) 25 MG tablet Take 1 tablet (25 mg total) by mouth every 6 (six) hours as needed for anxiety., Starting 09/02/2014, Until Discontinued, Normal        2.  Patient Education/Counseling:  The patient was given appropriate handouts, self care instructions, and instructed in symptomatic relief.    3.  Follow up:  The patient was told to follow up here if no better in 3 to 4 days, or sooner if becoming worse in  any way, and give an an some red flag symptoms such as worsening pain, shortness of breath, dizziness, or passing out which would prompt immediate return.      Reuben Likes, MD 09/02/14 [phone]

## 2014-09-02 NOTE — ED Notes (Signed)
Pt  Seen   Er    X  2  Recently  For  Chest  Pain   He     Had   A   Workup         At that  Time     He  Has   appt  Oct 7  With  A  Cardiologist         He  denys  Any  Chest pain  At this  Time         Sitting upright on exam table  Speaking in  Complete  sentances

## 2014-09-22 ENCOUNTER — Ambulatory Visit (INDEPENDENT_AMBULATORY_CARE_PROVIDER_SITE_OTHER): Payer: Self-pay | Admitting: Cardiovascular Disease

## 2014-09-22 ENCOUNTER — Encounter: Payer: Self-pay | Admitting: *Deleted

## 2014-09-22 VITALS — BP 118/86 | HR 62 | Ht 78.0 in | Wt 202.0 lb

## 2014-09-22 DIAGNOSIS — R079 Chest pain, unspecified: Secondary | ICD-10-CM | POA: Insufficient documentation

## 2014-09-22 DIAGNOSIS — R0789 Other chest pain: Secondary | ICD-10-CM

## 2014-09-22 NOTE — Patient Instructions (Signed)
Your physician recommends that you schedule a follow-up appointment in:  3 MONTHS WITH  DR NISHAN  Your physician recommends that you continue on your current medications as directed. Please refer to the Current Medication list given to you today.  

## 2014-09-22 NOTE — Assessment & Plan Note (Signed)
Atypical normal ECG no high risk family history  He currently has no insurance Will see in 3 months When he has insurance would like to do stress echo or cardiac CT to r/o HOCM or congenital coronary disease

## 2014-09-22 NOTE — Progress Notes (Signed)
Patient ID: Jose Cummings, male   DOB: 08/21/1991, 23 y.o.   MRN: [id number] Jose Cummings is a 23 year old male who has had episodes of chest pain intermittently throughout the past 3 weeks. Had 2 episodes of chest discomfort this month and went to ER . One of these occurred while playing basketball, the other while he was sitting. The pain begins rather suddenly in the substernal area with radiation towards the right chest. It's described as a pressure rated 8/10 in intensity. Right now he has no pain in there is no radiation to the neck, the back, or into the left arm. He denies any associated shortness of breath, nausea, or diaphoresis, but he has had some associated palpitations, nervousness, and tremulousness. On both occasions he went to the emergency room with the chest pain. On both occasions he had completely normal EKGs with the exception of benign early repolarization with normal enzymes. Myocardial infarction was ruled out.   Seen in ER 8.30 and 9.8  Seen in urgent care 9/17      ROS: Denies fever, malais, weight loss, blurry vision, decreased visual acuity, cough, sputum, SOB, hemoptysis, pleuritic pain, palpitaitons, heartburn, abdominal pain, melena, lower extremity edema, claudication, or rash.  All other systems reviewed and negative  General: Affect appropriate Healthy:  appears stated age HEENT: normal Neck supple with no adenopathy JVP normal no bruits no thyromegaly Lungs clear with no wheezing and good diaphragmatic motion Heart:  S1/S2 no murmur, no rub, gallop or click PMI normal Abdomen: benighn, BS positve, no tenderness, no AAA no bruit.  No HSM or HJR Distal pulses intact with no bruits No edema Neuro non-focal Skin warm and dry No muscular weakness   Current Outpatient Prescriptions  Medication Sig Dispense Refill  . ALPRAZolam (XANAX) 0.5 MG tablet Take 0.5 mg by mouth once.      . hydrOXYzine (ATARAX/VISTARIL) 25 MG tablet Take 1 tablet (25 mg total) by  mouth every 6 (six) hours as needed for anxiety.  30 tablet  0  . naproxen sodium (ALEVE) 220 MG tablet Take 220-440 mg by mouth 2 (two) times daily as needed (for pain).        No current facility-administered medications for this visit.    Allergies  Review of patient's allergies indicates no known allergies.  Electrocardiogram:  SR rate 72 normal   Assessment and Plan

## 2014-12-20 NOTE — Progress Notes (Signed)
Patient ID: Jose Cummings, male   DOB: 09-16-1991, 24 y.o.   MRN: [id number] Jose Cummings is a 24 year old male seen 10/15  who has had episodes of chest pain intermittently throughout the past 3 weeks. Had 2 episodes of chest discomfort this month and went to ER . One of these occurred while playing basketball, the other while he was sitting. The pain begins rather suddenly in the substernal area with radiation towards the right chest. It's described as a pressure rated 8/10 in intensity. Right now he has no pain in there is no radiation to the neck, the back, or into the left arm. He denies any associated shortness of breath, nausea, or diaphoresis, but he has had some associated palpitations, nervousness, and tremulousness. On both occasions he went to the emergency room with the chest pain. On both occasions he had completely normal EKGs with the exception of benign early repolarization with normal enzymes. Myocardial infarction was ruled out.   Seen in ER 8.30 and 9.8  Seen in urgent care 9/17    No insurance at time and no stress testing done  Consider CT/echo to r/o HOCM or congenital coronary anomaly when he has insurance     ROS: Denies fever, malais, weight loss, blurry vision, decreased visual acuity, cough, sputum, SOB, hemoptysis, pleuritic pain, palpitaitons, heartburn, abdominal pain, melena, lower extremity edema, claudication, or rash.  All other systems reviewed and negative  General: Affect appropriate Healthy:  appears stated age HEENT: normal Neck supple with no adenopathy JVP normal no bruits no thyromegaly Lungs clear with no wheezing and good diaphragmatic motion Heart:  S1/S2 no murmur, no rub, gallop or click PMI normal Abdomen: benighn, BS positve, no tenderness, no AAA no bruit.  No HSM or HJR Distal pulses intact with no bruits No edema Neuro non-focal Skin warm and dry No muscular weakness   Current Outpatient Prescriptions  Medication Sig Dispense  Refill  . ALPRAZolam (XANAX) 0.5 MG tablet Take 0.5 mg by mouth once.    . hydrOXYzine (ATARAX/VISTARIL) 25 MG tablet Take 1 tablet (25 mg total) by mouth every 6 (six) hours as needed for anxiety. 30 tablet 0  . naproxen sodium (ALEVE) 220 MG tablet Take 220-440 mg by mouth 2 (two) times daily as needed (for pain).      No current facility-administered medications for this visit.    Allergies  Review of patient's allergies indicates no known allergies.  Electrocardiogram:  SR rate 72 normal  10/15  Assessment and Plan

## 2014-12-21 ENCOUNTER — Encounter: Payer: Self-pay | Admitting: Cardiovascular Disease

## 2014-12-24 DIAGNOSIS — Z23 Encounter for immunization: Secondary | ICD-10-CM | POA: Diagnosis not present

## 2014-12-24 DIAGNOSIS — S61212A Laceration without foreign body of right middle finger without damage to nail, initial encounter: Secondary | ICD-10-CM | POA: Diagnosis present

## 2014-12-24 DIAGNOSIS — Z79899 Other long term (current) drug therapy: Secondary | ICD-10-CM | POA: Insufficient documentation

## 2014-12-24 DIAGNOSIS — X58XXXA Exposure to other specified factors, initial encounter: Secondary | ICD-10-CM | POA: Insufficient documentation

## 2014-12-24 DIAGNOSIS — Y998 Other external cause status: Secondary | ICD-10-CM | POA: Diagnosis not present

## 2014-12-24 DIAGNOSIS — Y929 Unspecified place or not applicable: Secondary | ICD-10-CM | POA: Diagnosis not present

## 2014-12-24 DIAGNOSIS — Y9389 Activity, other specified: Secondary | ICD-10-CM | POA: Diagnosis not present

## 2014-12-24 DIAGNOSIS — Z87891 Personal history of nicotine dependence: Secondary | ICD-10-CM | POA: Insufficient documentation

## 2014-12-25 ENCOUNTER — Encounter (HOSPITAL_COMMUNITY): Payer: Self-pay | Admitting: Emergency Medicine

## 2014-12-25 ENCOUNTER — Emergency Department (HOSPITAL_COMMUNITY)
Admission: EM | Admit: 2014-12-25 | Discharge: 2014-12-25 | Disposition: A | Discharge status: Home / Self Care | Payer: No Typology Code available for payment source | Attending: Emergency Medicine | Admitting: Emergency Medicine

## 2014-12-25 ENCOUNTER — Emergency Department (HOSPITAL_COMMUNITY): Payer: No Typology Code available for payment source

## 2014-12-25 DIAGNOSIS — T1490XA Injury, unspecified, initial encounter: Secondary | ICD-10-CM

## 2014-12-25 DIAGNOSIS — S61209A Unspecified open wound of unspecified finger without damage to nail, initial encounter: Secondary | ICD-10-CM

## 2014-12-25 MED ORDER — TETANUS-DIPHTH-ACELL PERTUSSIS 5-2.5-18.5 LF-MCG/0.5 IM SUSP
0.5000 mL | Freq: Once | INTRAMUSCULAR | Status: AC
  Administered 2014-12-25: 0.5 mL via INTRAMUSCULAR
  Filled 2014-12-25: qty 0.5

## 2014-12-25 MED ORDER — HYDROCODONE-ACETAMINOPHEN 5-325 MG PO TABS: 1.0000 | ORAL_TABLET | Freq: Four times a day (QID) | ORAL | Status: DC | PRN

## 2014-12-25 MED ORDER — HYDROCODONE-ACETAMINOPHEN 5-325 MG PO TABS
1.0000 | ORAL_TABLET | Freq: Once | ORAL | Status: AC
  Administered 2014-12-25: 1 via ORAL
  Filled 2014-12-25: qty 1
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
## 2014-12-25 NOTE — Discharge Instructions (Signed)
Laceration Care, Adult °A laceration is a cut or lesion that goes through all layers of the skin and into the tissue just beneath the skin. °TREATMENT  °Some lacerations may not require closure. Some lacerations may not be able to be closed due to an increased risk of infection. It is important to see your caregiver as soon as possible after an injury to minimize the risk of infection and maximize the opportunity for successful closure. °If closure is appropriate, pain medicines may be given, if needed. The wound will be cleaned to help prevent infection. Your caregiver will use stitches (sutures), staples, wound glue (adhesive), or skin adhesive strips to repair the laceration. These tools bring the skin edges together to allow for faster healing and a better cosmetic outcome. However, all wounds will heal with a scar. Once the wound has healed, scarring can be minimized by covering the wound with sunscreen during the day for 1 full year. °HOME CARE INSTRUCTIONS  °For sutures or staples: °· Keep the wound clean and dry. °· If you were given a bandage (dressing), you should change it at least once a day. Also, change the dressing if it becomes wet or dirty, or as directed by your caregiver. °· Wash the wound with soap and water 2 times a day. Rinse the wound off with water to remove all soap. Pat the wound dry with a clean towel. °· After cleaning, apply a thin layer of the antibiotic ointment as recommended by your caregiver. This will help prevent infection and keep the dressing from sticking. °· You may shower as usual after the first 24 hours. Do not soak the wound in water until the sutures are removed. °· Only take over-the-counter or prescription medicines for pain, discomfort, or fever as directed by your caregiver. °· Get your sutures or staples removed as directed by your caregiver. °For skin adhesive strips: °· Keep the wound clean and dry. °· Do not get the skin adhesive strips wet. You may bathe  carefully, using caution to keep the wound dry. °· If the wound gets wet, pat it dry with a clean towel. °· Skin adhesive strips will fall off on their own. You may trim the strips as the wound heals. Do not remove skin adhesive strips that are still stuck to the wound. They will fall off in time. °For wound adhesive: °· You may briefly wet your wound in the shower or bath. Do not soak or scrub the wound. Do not swim. Avoid periods of heavy perspiration until the skin adhesive has fallen off on its own. After showering or bathing, gently pat the wound dry with a clean towel. °· Do not apply liquid medicine, cream medicine, or ointment medicine to your wound while the skin adhesive is in place. This may loosen the film before your wound is healed. °· If a dressing is placed over the wound, be careful not to apply tape directly over the skin adhesive. This may cause the adhesive to be pulled off before the wound is healed. °· Avoid prolonged exposure to sunlight or tanning lamps while the skin adhesive is in place. Exposure to ultraviolet light in the first year will darken the scar. °· The skin adhesive will usually remain in place for 5 to 10 days, then naturally fall off the skin. Do not pick at the adhesive film. °You may need a tetanus shot if: °· You cannot remember when you had your last tetanus shot. °· You have never had a tetanus   shot. If you get a tetanus shot, your arm may swell, get red, and feel warm to the touch. This is common and not a problem. If you need a tetanus shot and you choose not to have one, there is a rare chance of getting tetanus. Sickness from tetanus can be serious. SEEK MEDICAL CARE IF:   You have redness, swelling, or increasing pain in the wound.  You see a red line that goes away from the wound.  You have yellowish-white fluid (pus) coming from the wound.  You have a fever.  You notice a bad smell coming from the wound or dressing.  Your wound breaks open before or  after sutures have been removed.  You notice something coming out of the wound such as wood or glass.  Your wound is on your hand or foot and you cannot move a finger or toe. SEEK IMMEDIATE MEDICAL CARE IF:   Your pain is not controlled with prescribed medicine.  You have severe swelling around the wound causing pain and numbness or a change in color in your arm, hand, leg, or foot.  Your wound splits open and starts bleeding.  You have worsening numbness, weakness, or loss of function of any joint around or beyond the wound.  You develop painful lumps near the wound or on the skin anywhere on your body. MAKE SURE YOU:   Understand these instructions.  Will watch your condition.  Will get help right away if you are not doing well or get worse. Document Released: 12/03/2005 Document Revised: 02/25/2012 Document Reviewed: 05/29/2011 [facility] Patient Information 2015 [place], [place]. This information is not intended to replace advice given to you by your health care provider. Make sure you discuss any questions you have with your health care provider.  Deep Skin Avulsion A deep skin avulsion is when all layers of the skin or parts of body structures have been torn away. This is usually a result of severe injury (trauma). A deep skin avulsion can include damage to important structures beneath the skin such as tendons, ligaments, nerves, or blood vessels.  CAUSES  Many injuries can lead to a deep skin avulsion. These include:   Crush injuries.  Bites.  Falls against jagged surfaces.  Gunshot wounds.  Severe burns and injuries involving dragging (such as those from a bicycle or motorcycle accident). TREATMENT   If the wound is small and there is no damage to vital structures like nerves and blood vessels, the damaged tissues may be removed. Then, the wound can be cleaned thoroughly and closed.  A skin graft may be performed. This is a procedure in which the outer layer of skin  is removed from a different part of your body. That skin (skin graft) is used to cover the open wound. This can happen after damaged tissue is removed and repairs are completed.  Your caregiver may onlyapply a bandage (dressing) to the wound. The wound will be kept clean and allowed to heal. Healing can take weeks or months and usually leaves a large scar. This type of treatment is only done if your caregiver feels that skin grafting or a similar procedure would not work. You might need a tetanus shot if:  You cannot remember when you had your last tetanus shot.  You have never had a tetanus shot.  The injury broke your skin. If you got a tetanus shot, your arm may swell, get red, and feel warm to the touch. This is common and not a  problem. If you need a tetanus shot and you choose not to have one, there is a rare chance of getting tetanus. Sickness from tetanus can be serious. HOME CARE INSTRUCTIONS   Only take over-the-counter or prescription medicines for pain, discomfort, or fever as directed by your caregiver.  Gently wash the area with mild soap and water 2 times a day, or as directed. Rinse off the soap. Pat the area dry with a clean towel. Do not rub the wound. This may cause bleeding.  Follow your caregiver's instructions for how often you need to change the dressing.  Apply ointment and a dressing to the wound as directed.  If the dressing sticks, moisten it with soapy water and gently remove it.  Change the bandage right away if it becomes wet, dirty, or starts to smell bad.  Take showers. Do not take tub baths, swim, or do anything that may soak the wound until it is healed.  Use anti-itch medicine as directed by your caregiver. The wound may itch when it is healing. Do not pick or scratch at the wound.  Follow up with your caregiver for stitches (sutures), staple, or skin adhesive strip removal. SEEK MEDICAL CARE IF:   You have redness, swelling, or increasing pain in  your wound.  A red streak or line extends away from the wound.  You have pus coming from the wound.  You notice a bad smell coming from thewound or dressing.  The wound breaks open (edges not staying together) after sutures have been removed.  You notice something coming out of the wound, such as a small piece of wood, glass, or metal.  You are unable to properly move a finger or toe if the wound is on your hand or foot.  You have severe swelling around the wound that causes pain and numbness.  Your arm, hand, leg, or foot changes color. SEEK IMMEDIATE MEDICAL CARE IF:   Your pain becomes severe or is not adequately relieved with pain medicine.  You have a fever.  You have nausea and vomiting for more than 24 hours.  You feel lightheaded, weak, or faint.  You develop chest pain or difficulty breathing. MAKE SURE YOU:   Understand these instructions.  Will watch your condition.  Will get help right away if you are not doing well or get worse. Document Released: 01/29/2007 Document Revised: 02/25/2012 Document Reviewed: 04/08/2011 [facility] Patient Information 2015 [place], [place]. This information is not intended to replace advice given to you by your health care provider. Make sure you discuss any questions you have with your health care provider.

## 2014-12-25 NOTE — ED Notes (Signed)
Bleeding controled

## 2015-01-08 NOTE — Progress Notes (Signed)
Patient ID: Jose DikeFarrakhan Marucci, male   DOB: Feb 16, 1991, 24 y.o.   MRN: [id number] Jose Cummings is a 24 y.o.  male who was seen 10/15   had episodes of chest pain intermittently throughout the past 3 weeks. Had 2 episodes of chest discomfort this month and went to ER . One of these occurred while playing basketball, the other while he was sitting. The pain begins rather suddenly in the substernal area with radiation towards the right chest. It's described as a pressure rated 8/10 in intensity. Right now he has no pain in there is no radiation to the neck, the back, or into the left arm. He denies any associated shortness of breath, nausea, or diaphoresis, but he has had some associated palpitations, nervousness, and tremulousness. On both occasions he went to the emergency room with the chest pain. On both occasions he had completely normal EKGs with the exception of benign early repolarization with normal enzymes. Myocardial infarction was ruled out.   Seen in ER 8.30 and 9.8  Seen in urgent care 9/17   Had no insurance last visit and declined testing      ROS: Denies fever, malais, weight loss, blurry vision, decreased visual acuity, cough, sputum, SOB, hemoptysis, pleuritic pain, palpitaitons, heartburn, abdominal pain, melena, lower extremity edema, claudication, or rash.  All other systems reviewed and negative  General: Affect appropriate Healthy:  appears stated age HEENT: normal Neck supple with no adenopathy JVP normal no bruits no thyromegaly Lungs clear with no wheezing and good diaphragmatic motion Heart:  S1/S2 no murmur, no rub, gallop or click PMI normal Abdomen: benighn, BS positve, no tenderness, no AAA no bruit.  No HSM or HJR Distal pulses intact with no bruits No edema Neuro non-focal Skin warm and dry No muscular weakness   Current Outpatient Prescriptions  Medication Sig Dispense Refill  . ALPRAZolam (XANAX) 0.5 MG tablet Take 0.5 mg by mouth once.    Marland Kitchen.  HYDROcodone-acetaminophen (NORCO/VICODIN) 5-325 MG per tablet Take 1 tablet by mouth every 6 (six) hours as needed. 6 tablet 0  . hydrOXYzine (ATARAX/VISTARIL) 25 MG tablet Take 1 tablet (25 mg total) by mouth every 6 (six) hours as needed for anxiety. 30 tablet 0  . naproxen sodium (ALEVE) 220 MG tablet Take 220-440 mg by mouth 2 (two) times daily as needed (for pain).      No current facility-administered medications for this visit.    Allergies  Review of patient's allergies indicates no known allergies.  Electrocardiogram:  SR rate 72 normal  10/15  Assessment and Plan

## 2015-01-10 ENCOUNTER — Encounter: Payer: Self-pay | Admitting: Cardiovascular Disease

## 2015-01-25 NOTE — Progress Notes (Signed)
Patient ID: Jose Cummings, male   DOB: Feb 09, 1991, 24 y.o.   MRN: [id number] Jose Cummings is a 24 y.o. male  Initially seen 10/15 with  episodes of chest pain intermittently throughout the past 3 weeks. Had 2 episodes of chest discomfort October and went to ER . One of these occurred while playing basketball, the other while he was sitting. The pain begins rather suddenly in the substernal area with radiation towards the right chest. It's described as a pressure rated 8/10 in intensity. Right now he has no pain in there is no radiation to the neck, the back, or into the left arm. He denies any associated shortness of breath, nausea, or diaphoresis, but he has had some associated palpitations, nervousness, and tremulousness. On both occasions he went to the emergency room with the chest pain. On both occasions he had completely normal EKGs with the exception of benign early repolarization with normal enzymes. Myocardial infarction was ruled out.   Seen in ER 8.30 /15 and 9.8  /15 Seen in urgent care 09/02/14     ROS: Denies fever, malais, weight loss, blurry vision, decreased visual acuity, cough, sputum, SOB, hemoptysis, pleuritic pain, palpitaitons, heartburn, abdominal pain, melena, lower extremity edema, claudication, or rash.  All other systems reviewed and negative  General: Affect appropriate Healthy:  appears stated age HEENT: normal Neck supple with no adenopathy JVP normal no bruits no thyromegaly Lungs clear with no wheezing and good diaphragmatic motion Heart:  S1/S2 no murmur, no rub, gallop or click PMI normal Abdomen: benighn, BS positve, no tenderness, no AAA no bruit.  No HSM or HJR Distal pulses intact with no bruits No edema Neuro non-focal Skin warm and dry No muscular weakness   Current Outpatient Prescriptions  Medication Sig Dispense Refill  . ALPRAZolam (XANAX) 0.5 MG tablet Take 0.5 mg by mouth once.    Marland Kitchen. HYDROcodone-acetaminophen (NORCO/VICODIN) 5-325 MG per  tablet Take 1 tablet by mouth every 6 (six) hours as needed. 6 tablet 0  . hydrOXYzine (ATARAX/VISTARIL) 25 MG tablet Take 1 tablet (25 mg total) by mouth every 6 (six) hours as needed for anxiety. 30 tablet 0  . naproxen sodium (ALEVE) 220 MG tablet Take 220-440 mg by mouth 2 (two) times daily as needed (for pain).      No current facility-administered medications for this visit.    Allergies  Review of patient's allergies indicates no known allergies.  Electrocardiogram:  SR rate 72 normal  10/15   Assessment and Plan

## 2015-01-26 ENCOUNTER — Encounter: Payer: Self-pay | Admitting: Cardiovascular Disease

## 2015-01-28 ENCOUNTER — Ambulatory Visit (INDEPENDENT_AMBULATORY_CARE_PROVIDER_SITE_OTHER): Payer: No Typology Code available for payment source | Admitting: Cardiovascular Disease

## 2015-01-28 ENCOUNTER — Encounter: Payer: Self-pay | Admitting: Cardiovascular Disease

## 2015-01-28 VITALS — BP 106/64 | HR 77 | Ht 79.0 in | Wt 223.1 lb

## 2015-01-28 DIAGNOSIS — R072 Precordial pain: Secondary | ICD-10-CM

## 2015-01-28 DIAGNOSIS — R079 Chest pain, unspecified: Secondary | ICD-10-CM

## 2015-01-28 NOTE — Patient Instructions (Signed)
Your physician recommends that you schedule a follow-up appointment in:  AS NEEDED  Your physician recommends that you continue on your current medications as directed. Please refer to the Current Medication list given to you today. Your physician has requested that you have an echocardiogram. Echocardiography is a painless test that uses sound waves to create images of your heart. It provides your doctor with information about the size and shape of your heart and how well your heart's chambers and valves are working. This procedure takes approximately one hour. There are no restrictions for this procedure.   Your physician has requested that you have an exercise tolerance test. For further information please visit [email or web address]. Please also follow instruction sheet, as given.  

## 2015-01-28 NOTE — Progress Notes (Signed)
Patient ID: Jose Cummings, male   DOB: 1991/02/12, 24 y.o.   MRN: [id number] Jose Cummings is a 24 y.o.  male who has had episodes of chest pain intermittently throughout the past 3 weeks. Had 2 episodes of chest discomfort this month and went to ER . One of these occurred while playing basketball, the other while he was sitting. The pain begins rather suddenly in the substernal area with radiation towards the right chest. It's described as a pressure rated 8/10 in intensity. Right now he has no pain in there is no radiation to the neck, the back, or into the left arm. He denies any associated shortness of breath, nausea, or diaphoresis, but he has had some associated palpitations, nervousness, and tremulousness. On both occasions he went to the emergency room with the chest pain. On both occasions he had completely normal EKGs with the exception of benign early repolarization with normal enzymes. Myocardial infarction was ruled out.   Seen in ER 8.30 and 9.8  Seen in urgent care 9/17   Continues to have episodes of pain especially when playing basketball at [employer] He is working at [employer] now and has insurance     ROS: Denies fever, malais, weight loss, blurry vision, decreased visual acuity, cough, sputum, SOB, hemoptysis, pleuritic pain, palpitaitons, heartburn, abdominal pain, melena, lower extremity edema, claudication, or rash.  All other systems reviewed and negative  General: Affect appropriate Healthy:  appears stated age HEENT: normal Neck supple with no adenopathy JVP normal no bruits no thyromegaly Lungs clear with no wheezing and good diaphragmatic motion Heart:  S1/S2 no murmur, no rub, gallop or click PMI normal Abdomen: benighn, BS positve, no tenderness, no AAA no bruit.  No HSM or HJR Distal pulses intact with no bruits No edema Neuro non-focal Skin warm and dry No muscular weakness   Current Outpatient Prescriptions  Medication Sig Dispense Refill  .  hydrOXYzine (ATARAX/VISTARIL) 25 MG tablet Take 1 tablet (25 mg total) by mouth every 6 (six) hours as needed for anxiety. 30 tablet 0   No current facility-administered medications for this visit.    Allergies  Review of patient's allergies indicates no known allergies.  Electrocardiogram:  SR rate 72 normal   Assessment and Plan

## 2015-01-28 NOTE — Assessment & Plan Note (Signed)
Persistant  Given young age and sports need to r/o congenital disease and HOCM  Will order ETT and echo  Exam and ECG reassuring.  No family history of sudden death or congenital disease

## 2015-03-02 ENCOUNTER — Other Ambulatory Visit: Payer: Self-pay | Admitting: Orthopaedic Surgery

## 2015-03-02 DIAGNOSIS — M25562 Pain in left knee: Secondary | ICD-10-CM

## 2015-03-07 ENCOUNTER — Ambulatory Visit (INDEPENDENT_AMBULATORY_CARE_PROVIDER_SITE_OTHER): Payer: No Typology Code available for payment source | Admitting: Physician Assistant

## 2015-03-07 ENCOUNTER — Ambulatory Visit (HOSPITAL_COMMUNITY): Payer: No Typology Code available for payment source | Attending: Cardiology | Admitting: Cardiology

## 2015-03-07 DIAGNOSIS — R079 Chest pain, unspecified: Secondary | ICD-10-CM

## 2015-03-07 NOTE — Progress Notes (Signed)
Echo performed. 

## 2015-03-07 NOTE — Progress Notes (Signed)
Exercise Treadmill Test  Pre-Exercise Testing Evaluation Rhythm: normal sinus  Rate: 71     Test  Exercise Tolerance Test Ordering MD: Charlton HawsPeter Nishan, MD  Interpreting MD: Tereso NewcomerScott Aleeta Schmaltz, PA-C  Unique Test No: 1  Treadmill:  1  Indication for ETT: chest pain - rule out ischemia  Contraindication to ETT: No   Stress Modality: exercise - treadmill  Cardiac Imaging Performed: non   Protocol: standard Bruce - maximal  Max BP:  194/72  Max MPHR (bpm):  194 85% MPR (bpm):  165  MPHR obtained (bpm):  179 % MPHR obtained:  92  Reached 85% MPHR (min:sec):  9:59 Total Exercise Time (min-sec):  11:00  Workload in METS:  13.4 Borg Scale: 15  Reason ETT Terminated:  desired heart rate attained    ST Segment Analysis At Rest: normal ST segments - no evidence of significant ST depression With Exercise: no evidence of significant ST depression  Other Information Arrhythmia:  No Angina during ETT:  absent (0) Quality of ETT:  diagnostic  ETT Interpretation:  normal - no evidence of ischemia by ST analysis  Comments: Good exercise capacity. No chest pain. Normal BP response to exercise. No ST changes to suggest ischemia.   Recommendations: FU with Dr. Charlton HawsPeter Nishan as directed. Signed,  Tereso NewcomerScott Branson Kranz, PA-C   03/07/2015 11:22 AM

## 2015-03-21 ENCOUNTER — Other Ambulatory Visit: Payer: No Typology Code available for payment source

## 2015-03-30 ENCOUNTER — Ambulatory Visit
Admission: RE | Admit: 2015-03-30 | Discharge: 2015-03-30 | Disposition: A | Discharge status: Home / Self Care | Payer: No Typology Code available for payment source | Source: Ambulatory Visit | Attending: Orthopaedic Surgery | Admitting: Orthopaedic Surgery

## 2015-03-30 DIAGNOSIS — M25562 Pain in left knee: Secondary | ICD-10-CM

## 2016-01-30 ENCOUNTER — Emergency Department (HOSPITAL_COMMUNITY)
Admission: EM | Admit: 2016-01-30 | Discharge: 2016-01-30 | Disposition: A | Discharge status: Home / Self Care | Payer: Self-pay | Attending: Emergency Medicine | Admitting: Emergency Medicine

## 2016-01-30 ENCOUNTER — Emergency Department (HOSPITAL_COMMUNITY): Payer: Self-pay

## 2016-01-30 ENCOUNTER — Encounter (HOSPITAL_COMMUNITY): Payer: Self-pay | Admitting: *Deleted

## 2016-01-30 DIAGNOSIS — Y998 Other external cause status: Secondary | ICD-10-CM | POA: Insufficient documentation

## 2016-01-30 DIAGNOSIS — Y9302 Activity, running: Secondary | ICD-10-CM | POA: Insufficient documentation

## 2016-01-30 DIAGNOSIS — S8992XA Unspecified injury of left lower leg, initial encounter: Secondary | ICD-10-CM | POA: Insufficient documentation

## 2016-01-30 DIAGNOSIS — Z9889 Other specified postprocedural states: Secondary | ICD-10-CM | POA: Insufficient documentation

## 2016-01-30 DIAGNOSIS — Y9289 Other specified places as the place of occurrence of the external cause: Secondary | ICD-10-CM | POA: Insufficient documentation

## 2016-01-30 DIAGNOSIS — X58XXXA Exposure to other specified factors, initial encounter: Secondary | ICD-10-CM | POA: Insufficient documentation

## 2016-01-30 DIAGNOSIS — Y9389 Activity, other specified: Secondary | ICD-10-CM | POA: Insufficient documentation

## 2016-01-30 DIAGNOSIS — Z87891 Personal history of nicotine dependence: Secondary | ICD-10-CM | POA: Insufficient documentation

## 2016-01-30 MED ORDER — NAPROXEN 250 MG PO TABS
500.0000 mg | ORAL_TABLET | Freq: Once | ORAL | Status: AC
  Administered 2016-01-30: 500 mg via ORAL
  Filled 2016-01-30: qty 2

## 2016-01-30 MED ORDER — ACETAMINOPHEN 325 MG PO TABS: 650.0000 mg | ORAL_TABLET | Freq: Four times a day (QID) | ORAL | Status: DC | PRN

## 2016-01-30 MED ORDER — ACETAMINOPHEN 500 MG PO TABS
1000.0000 mg | ORAL_TABLET | Freq: Once | ORAL | Status: AC
  Administered 2016-01-30: 1000 mg via ORAL
  Filled 2016-01-30: qty 2

## 2016-01-30 MED ORDER — NAPROXEN 500 MG PO TABS: 500.0000 mg | ORAL_TABLET | Freq: Two times a day (BID) | ORAL | Status: DC

## 2016-01-30 NOTE — Discharge Instructions (Signed)
Your x-ray showed no evidence of fracture or dislocation. Please follow up with ortho for follow up. As we discussed, you might have a soft tissue injury that we cannot see on x-ray today. In the meantime I will give you prescriptions to help with your pain and swelling. You may continue icing your knee on and off for the next 48 hours. Return to the ER for new or worsening symptoms.

## 2016-01-30 NOTE — ED Provider Notes (Signed)
CSN: [id number]     Arrival date & time 01/30/16  1615 History  By signing my name below, I, Jose Cummings, attest that this documentation has been prepared under the direction and in the presence of non-physician practitioner, Noelle Penner, PA-C. Electronically Signed: Linna Cummings, Scribe. 01/30/2016. 5:32 PM.    Chief Complaint  Patient presents with  . Knee Pain    The history is provided by the patient. No language interpreter was used.     HPI Comments: Jose Cummings is a 25 y.o. male with PSHx of right knee surgery who presents to the Emergency Department complaining of sudden onset, constant, 8/10, left knee pain beginning yesterday. He reports that he was running and "moved the wrong way" when he felt a pop in his left knee. Pt states that the pain radiates throughout his left knee. Pt states that he tore his left meniscus last year, had been seeing Dr. Magnus Ivan but elected to defer surgery. Pt states that he is able to ambulate. He denies trying anything for the pain. He denies numbness or any other associated symptoms at this time.    History reviewed. No pertinent past medical history. Past Surgical History  Procedure Laterality Date  . Knee surgery  2010    right knee   No family history on file. Social History  Substance Use Topics  . Smoking status: Former Smoker -- 0.10 packs/day    Types: Cigarettes  . Smokeless tobacco: None  . Alcohol Use: Yes     Comment: occasionally    Review of Systems  Musculoskeletal: Positive for arthralgias (left knee).  Neurological: Negative for numbness.  All other systems reviewed and are negative.     Allergies  Review of patient's allergies indicates no known allergies.  Home Medications   Prior to Admission medications   Medication Sig Start Date End Date Taking? Authorizing Provider  hydrOXYzine (ATARAX/VISTARIL) 25 MG tablet Take 1 tablet (25 mg total) by mouth every 6 (six) hours as needed for anxiety. 09/02/14    Reuben Likes, MD   BP 148/83 mmHg  Pulse 57  Temp(Src) 99.2 F (37.3 C) (Oral)  Resp 14  SpO2 97% Physical Exam  Constitutional: He is oriented to person, place, and time. No distress.  HENT:  Head: Atraumatic.  Right Ear: External ear normal.  Left Ear: External ear normal.  Nose: Nose normal.  Eyes: Conjunctivae are normal. No scleral icterus.  Neck: Normal range of motion. Neck supple.  Cardiovascular: Normal rate and regular rhythm.   Pulmonary/Chest: Effort normal. No respiratory distress. He exhibits no tenderness.  Abdominal: Soft. He exhibits no distension. There is no tenderness.  Musculoskeletal:  Left knee with lateral and medial tenderness. Mild edema, particularly medial aspect. No erythema or warmth.    Neurological: He is alert and oriented to person, place, and time.  Skin: Skin is warm and dry. He is not diaphoretic.  Psychiatric: He has a normal mood and affect. His behavior is normal.  Nursing note and vitals reviewed.   ED Course  Procedures (including critical care time)  DIAGNOSTIC STUDIES: Oxygen Saturation is 97% on RA, normal by my interpretation.    COORDINATION OF CARE:  5:33 PM Will administer Naprosyn and Tylenol. Will order DG Knee Complete Left. Will apply knee immobilizer and ice. Will order crutches. Discussed treatment plan with pt at bedside and pt agreed to plan.    Labs Review Labs Reviewed - No data to display  Imaging Review Dg Knee Complete 4  Views Left  01/30/2016  CLINICAL DATA:  25 year old male with left knee injury and pain. EXAM: LEFT KNEE - COMPLETE 4+ VIEW COMPARISON:  Left knee MRI dated 03/30/2015 FINDINGS: There is no acute fracture or dislocation. There is mild narrowing of the medial compartment. A small suprapatellar effusion is noted. There is mild soft tissue swelling over the medial aspect of the deep. IMPRESSION: No acute osseous pathology. Small suprapatellar effusion. Electronically Signed   By: Elgie Collard M.D.   On: 01/30/2016 18:55   I have personally reviewed and evaluated these images as part of my medical decision-making.   EKG Interpretation None      MDM   Final diagnoses:  Left knee injury, initial encounter    XR negatifve for acute bony abnormality. Small suprapatellar effusion and medial soft tissue swelling. Discussed with pt cannot r/o soft tissue injury such as meniscal tear at this time. Will give knee brace and crutches with referral to ortho for f/u. Rx given for pain meds. ER return precautions given.   I personally performed the services described in this documentation, which was scribed in my presence. The recorded information has been reviewed and is accurate.     Carlene Coria, PA-C 01/30/16 2209  Doug Sou, MD 01/31/16 [phone]

## 2016-01-30 NOTE — ED Notes (Signed)
Pt ambulates independently and with steady gait at time of discharge. Discharge instructions and follow up information reviewed with patient. No other questions or concerns voiced at this time. RX x 2 given. 

## 2016-01-30 NOTE — ED Notes (Signed)
Pt reports left knee pain that started on Saturday after turning wrong. Pt states that he has had surgery to his left knee in the past.

## 2019-02-25 ENCOUNTER — Encounter (HOSPITAL_COMMUNITY): Payer: Self-pay | Admitting: Emergency Medicine

## 2019-02-25 ENCOUNTER — Other Ambulatory Visit: Payer: Self-pay

## 2019-02-25 ENCOUNTER — Emergency Department (HOSPITAL_COMMUNITY): Payer: No Typology Code available for payment source

## 2019-02-25 ENCOUNTER — Emergency Department (HOSPITAL_COMMUNITY)
Admission: EM | Admit: 2019-02-25 | Discharge: 2019-02-25 | Disposition: A | Discharge status: Home / Self Care | Payer: No Typology Code available for payment source | Attending: Emergency Medicine | Admitting: Emergency Medicine

## 2019-02-25 DIAGNOSIS — Z87891 Personal history of nicotine dependence: Secondary | ICD-10-CM | POA: Insufficient documentation

## 2019-02-25 DIAGNOSIS — R253 Fasciculation: Secondary | ICD-10-CM | POA: Insufficient documentation

## 2019-02-25 LAB — BASIC METABOLIC PANEL
ANION GAP: 9 (ref 5–15)
BUN: 18 mg/dL (ref 6–20)
CO2: 27 mmol/L (ref 22–32)
Calcium: 9.3 mg/dL (ref 8.9–10.3)
Chloride: 101 mmol/L (ref 98–111)
Creatinine, Ser: 1.14 mg/dL (ref 0.61–1.24)
GFR calc Af Amer: >60 mL/min (ref >60)
GLUCOSE: 104 mg/dL — AB (ref 70–99)
Potassium: 4 mmol/L (ref 3.5–5.1)
SODIUM: 137 mmol/L (ref 135–145)

## 2019-02-25 MED ORDER — CYCLOBENZAPRINE HCL 10 MG PO TABS: 10.0000 mg | ORAL_TABLET | Freq: Every evening | ORAL | 0 refills | Status: DC | PRN

## 2019-02-25 NOTE — ED Notes (Signed)
PA-C [place] Robinson advised she discharged the pt and went over his discharge paperwork to include instructions with him. She advised the pt had no questions in regards to the paperwork.

## 2019-02-25 NOTE — ED Provider Notes (Signed)
[facility] EMERGENCY DEPARTMENT Provider Note   CSN: [id number] Arrival date & time: 02/25/19  9381    History   Chief Complaint Chief Complaint  Patient presents with  . Chest Pain    HPI Jose Cummings is a 28 y.o. male with past medical history can significant for anxiety, presenting to the emergency department with complaint of 1 month of right-sided chest wall spasms.  Symptoms come and go, and keep him up at night.  He began having radiation of the spasming around his right side into his back/shoulder.  He has been trying to maintain his diet with high nutrients.  States he is fairly active, plays basketball often, however is decreased his activity somewhat since this began.  States his symptoms feel somewhat similar to his anxiety in the past.  He denies any associated chest pain, shortness of breath, cough, injuries, shoulder pain.  No cardiac history, history of DVT or PE, history of smoking.  No family history of sudden death associated with heart disease.      The history is provided by the patient.    History reviewed. No pertinent past medical history.  Patient Active Problem List   Diagnosis Date Noted  . Chest pain 09/22/2014    Past Surgical History:  Procedure Laterality Date  . KNEE SURGERY  2010   right knee      Home Medications    Prior to Admission medications   Medication Sig Start Date End Date Taking? Authorizing Provider  acetaminophen (TYLENOL) 325 MG tablet Take 2 tablets (650 mg total) by mouth every 6 (six) hours as needed. 01/30/16   Sam, Ace Gins, PA-C  cyclobenzaprine (FLEXERIL) 10 MG tablet Take 1 tablet (10 mg total) by mouth at bedtime as needed for muscle spasms. 02/25/19   Akiko Schexnider, [place] N, PA-C  hydrOXYzine (ATARAX/VISTARIL) 25 MG tablet Take 1 tablet (25 mg total) by mouth every 6 (six) hours as needed for anxiety. 09/02/14   Reuben Likes, MD  naproxen (NAPROSYN) 500 MG tablet Take 1 tablet (500 mg total) by  mouth 2 (two) times daily. 01/30/16   Carlene Coria, PA-C    Family History History reviewed. No pertinent family history.  Social History Social History   Tobacco Use  . Smoking status: Former Smoker    Packs/day: 0.10    Types: Cigarettes  Substance Use Topics  . Alcohol use: Yes    Comment: occasionally  . Drug use: Yes    Types: Marijuana     Allergies   Patient has no known allergies.   Review of Systems Review of Systems  Constitutional: Negative for diaphoresis.  Respiratory: Negative for cough and shortness of breath.   Cardiovascular: Negative for chest pain and leg swelling.  Gastrointestinal: Negative for abdominal pain and nausea.  Musculoskeletal: Positive for myalgias.  All other systems reviewed and are negative.    Physical Exam Updated Vital Signs BP 128/78   Pulse (!) 58   Temp 98.5 F (36.9 C) (Oral)   Resp (!) 21   Ht 6\' 6"  (1.981 m)   Wt 106.6 kg   SpO2 100%   BMI 27.16 kg/m   Physical Exam Vitals signs and nursing note reviewed.  Constitutional:      General: He is not in acute distress.    Appearance: He is well-developed. He is not ill-appearing.  HENT:     Head: Normocephalic and atraumatic.  Eyes:     Conjunctiva/sclera: Conjunctivae normal.  Neck:  Musculoskeletal: Normal range of motion and neck supple. No neck rigidity.  Cardiovascular:     Rate and Rhythm: Normal rate and regular rhythm.     Heart sounds: Normal heart sounds.  Pulmonary:     Effort: Pulmonary effort is normal. No respiratory distress.     Breath sounds: Normal breath sounds.  Chest:     Chest wall: No tenderness.  Abdominal:     General: Bowel sounds are normal. There is no distension.     Palpations: Abdomen is soft.     Tenderness: There is no abdominal tenderness. There is no guarding.  Musculoskeletal:        General: No swelling or tenderness.     Right lower leg: No edema.     Left lower leg: No edema.  Lymphadenopathy:     Cervical: No  cervical adenopathy.  Skin:    General: Skin is warm.  Neurological:     Mental Status: He is alert.  Psychiatric:        Behavior: Behavior normal.      ED Treatments / Results  Labs (all labs ordered are listed, but only abnormal results are displayed) Labs Reviewed  BASIC METABOLIC PANEL - Abnormal; Notable for the following components:      Result Value   Glucose, Bld 104 (*)    All other components within normal limits    EKG EKG Interpretation  Date/Time:  Wednesday February 25 2019 07:53:36 EDT Ventricular Rate:  70 PR Interval:    QRS Duration: 96 QT Interval:  381 QTC Calculation: 412 R Axis:   79 Text Interpretation:  Sinus rhythm ST elev, probable normal early repol pattern No acute changes No significant change since last tracing Confirmed by Derwood Kaplan [id number]) on 02/25/2019 8:11:25 AM   Radiology Dg Chest 2 View  Result Date: 02/25/2019 CLINICAL DATA:  Right-sided chest pain EXAM: CHEST - 2 VIEW COMPARISON:  08/24/2014 FINDINGS: The heart size and mediastinal contours are within normal limits. Both lungs are clear. The visualized skeletal structures are unremarkable. IMPRESSION: No acute abnormality of the lungs.  No focal airspace opacity. Electronically Signed   By: Lauralyn Primes M.D.   On: 02/25/2019 08:42    Procedures Procedures (including critical care time)  Medications Ordered in ED Medications - No data to display   Initial Impression / Assessment and Plan / ED Course  I have reviewed the triage vital signs and the nursing notes.  Pertinent labs & imaging results that were available during my care of the patient were reviewed by me and considered in my medical decision making (see chart for details).        Patient presenting with intermittent muscle spasms/twitching to the right chest wall intermittently for 1 month.  No chest pain, shortness of breath, or other associated symptoms.  On exam, heart and lung sounds normal.  No skin  changes.  Patient is well-appearing and nondistressed.  Symptoms are not consistent with ACS or pulmonary etiology. will obtain electrolytes, EKG, chest x-ray.  Chest x-ray is negative.  Normal electrolytes.  EKG is unchanged.  Will discharge with symptomatic management and encourage PCP follow-up.  Patient agreeable to plan and safe for discharge.  Discussed results, findings, treatment and follow up. Patient advised of return precautions. Patient verbalized understanding and agreed with plan.  Final Clinical Impressions(s) / ED Diagnoses   Final diagnoses:  Muscle twitching    ED Discharge Orders         Ordered  cyclobenzaprine (FLEXERIL) 10 MG tablet  At bedtime PRN     02/25/19 1013           Steffie Waggoner, [place] N, [place] 02/25/19 1023    Derwood Kaplan, MD 02/27/19 [phone]

## 2019-02-25 NOTE — ED Triage Notes (Signed)
Pt arrives to ED from home with complaints of right sided chest pain for a month. Pt reports that three das ago the pain started radiating to his left side of his chest. Pt now has trouble falling asleep due to the pain.

## 2019-02-25 NOTE — Discharge Instructions (Addendum)
You can take flexeril every 12 hours as needed for muscle spasm. Be aware this medication can make you drowsy, do not drive or drink alcohol while taking. Stay hydrated. Establish primary care.

## 2019-08-18 ENCOUNTER — Other Ambulatory Visit: Payer: Self-pay

## 2019-08-18 ENCOUNTER — Ambulatory Visit (INDEPENDENT_AMBULATORY_CARE_PROVIDER_SITE_OTHER): Payer: Self-pay | Admitting: Family Medicine

## 2019-08-18 DIAGNOSIS — R072 Precordial pain: Secondary | ICD-10-CM

## 2019-08-18 DIAGNOSIS — Z720 Tobacco use: Secondary | ICD-10-CM

## 2019-08-18 DIAGNOSIS — Z789 Other specified health status: Secondary | ICD-10-CM

## 2019-08-19 DIAGNOSIS — Z720 Tobacco use: Secondary | ICD-10-CM | POA: Insufficient documentation

## 2019-08-19 DIAGNOSIS — Z789 Other specified health status: Secondary | ICD-10-CM | POA: Insufficient documentation

## 2019-08-19 NOTE — Assessment & Plan Note (Signed)
For approximately 10 years patient has smoked small cigars on and off a couple days a week.  He does not consider himself addicted or dependent on tobacco.

## 2019-08-19 NOTE — Assessment & Plan Note (Signed)
Counseled about alcohol intake guidelines and continuing to not drink and drive

## 2019-08-19 NOTE — Assessment & Plan Note (Signed)
He did have an ED visit for this but it was found to not likely be ACS or PE related.  Patient has had no recurrence of this with no shortness of breath or chest pain despite physical exercise regularly.

## 2019-08-19 NOTE — Progress Notes (Signed)
    Subjective:  Jose Cummings is a 28 y.o. male who presents to the [facility] today with a chief complaint of establishing care.   HPI: Patient with no current symptoms or complaints, says that he is coming to establish care because he is 28 and he needs to make sure to take care of himself.  Most recent blood work was normal.  He is generally athletic and plays basketball regularly.  Problems as noted below are only listed through interview and not primary complaints  Alcohol consumption four to six days per week Counseled about alcohol intake guidelines and continuing to not drink and drive  Tobacco abuse For approximately 10 years patient has smoked small cigars on and off a couple days a week.  He does not consider himself addicted or dependent on tobacco.  Chest pain He did have an ED visit for this but it was found to not likely be ACS or PE related.  Patient has had no recurrence of this with no shortness of breath or chest pain despite physical exercise regularly.  Objective:  Physical Exam: BP 115/70   Pulse 77   Ht 6\' 6"  (1.981 m)   Wt 237 lb 3.2 oz (107.6 kg)   SpO2 99%   BMI 27.41 kg/m   Gen: NAD, conversing comfortably, athletic in appearance CV: RRR with no murmurs appreciated Pulm: NWOB, CTAB with no crackles, wheezes, or rhonchi MSK: no edema, cyanosis, or clubbing noted Skin: warm, dry Neuro: grossly normal, moves all extremities Psych: Normal affect and thought content  No results found for this or any previous visit (from the past 72 hour(s)).   Assessment/Plan:  Alcohol consumption four to six days per week Counseled about alcohol intake guidelines and continuing to not drink and drive  Tobacco abuse For approximately 10 years patient has smoked small cigars on and off a couple days a week.  He does not consider himself addicted or dependent on tobacco.  Chest pain He did have an ED visit for this but it was found to not likely be ACS or PE related.   Patient has had no recurrence of this with no shortness of breath or chest pain despite physical exercise regularly.   Sherene Sires, DO FAMILY MEDICINE RESIDENT - PGY3 08/19/2019 10:58 AM

## 2019-09-02 ENCOUNTER — Ambulatory Visit: Payer: Medicaid Other | Admitting: Family Medicine

## 2019-11-05 ENCOUNTER — Ambulatory Visit (HOSPITAL_COMMUNITY)
Admission: EM | Admit: 2019-11-05 | Discharge: 2019-11-05 | Disposition: A | Discharge status: Home / Self Care | Payer: Self-pay | Attending: Internal Medicine | Admitting: Internal Medicine

## 2019-11-05 ENCOUNTER — Encounter (HOSPITAL_COMMUNITY): Payer: Self-pay

## 2019-11-05 ENCOUNTER — Other Ambulatory Visit: Payer: Self-pay

## 2019-11-05 DIAGNOSIS — R05 Cough: Secondary | ICD-10-CM

## 2019-11-05 DIAGNOSIS — R059 Cough, unspecified: Secondary | ICD-10-CM

## 2019-11-05 NOTE — ED Triage Notes (Signed)
Pt. States he has had a cough, nasal drainage for 2 days now

## 2019-11-05 NOTE — ED Provider Notes (Signed)
[facility]    CSN: [id number] Arrival date & time: 11/05/19  1430      History   Chief Complaint Chief Complaint  Patient presents with  . Cough    HPI Delance Weide is a 28 y.o. male comes to urgent care with complaints of nasal congestion and cough of 2 days duration.  Patient denies any sick contacts.  No loss of taste or smell.  No chest pain or chest pressure.  Cough is not productive..  No nausea, vomiting or diarrhea.  Patient has some nasal drainage and postnasal drip.  HPI  No past medical history on file.  Patient Active Problem List   Diagnosis Date Noted  . Tobacco abuse 08/19/2019  . Alcohol consumption four to six days per week 08/19/2019  . Chest pain 09/22/2014    Past Surgical History:  Procedure Laterality Date  . KNEE SURGERY  2010   right knee       Home Medications    Prior to Admission medications   Not on File    Family History Family History  Problem Relation Age of Onset  . Healthy Mother   . Healthy Father     Social History Social History   Tobacco Use  . Smoking status: Former Smoker    Packs/day: 0.10    Types: Cigarettes  . Smokeless tobacco: Never Used  Substance Use Topics  . Alcohol use: Yes    Comment: occasionally  . Drug use: Yes    Types: Marijuana     Allergies   Patient has no known allergies.   Review of Systems Review of Systems  Constitutional: Positive for activity change. Negative for diaphoresis, fatigue and fever.  HENT: Positive for congestion, postnasal drip and rhinorrhea. Negative for sinus pressure, sinus pain, sore throat and voice change.   Respiratory: Positive for cough. Negative for chest tightness, shortness of breath and wheezing.   Cardiovascular: Negative.   Gastrointestinal: Negative for nausea and vomiting.  Genitourinary: Negative.   Neurological: Negative.      Physical Exam Triage Vital Signs ED Triage Vitals  Enc Vitals Group     BP 11/05/19 1542 (!)  148/80     Pulse Rate 11/05/19 1542 65     Resp 11/05/19 1542 18     Temp --      Temp Source 11/05/19 1542 Oral     SpO2 11/05/19 1542 100 %     Weight 11/05/19 1544 238 lb (108 kg)     Height --      Head Circumference --      Peak Flow --      Pain Score 11/05/19 1544 0     Pain Loc --      Pain Edu? --      Excl. in GC? --    No data found.  Updated Vital Signs BP (!) 148/80 (BP Location: Left Arm)   Pulse 65   Resp 18   Wt 108 kg   SpO2 100%   BMI 27.50 kg/m   Visual Acuity Right Eye Distance:   Left Eye Distance:   Bilateral Distance:    Right Eye Near:   Left Eye Near:    Bilateral Near:     Physical Exam Constitutional:      General: He is not in acute distress.    Appearance: He is not ill-appearing.  HENT:     Right Ear: Tympanic membrane normal.     Left Ear: Tympanic membrane normal.  Nose: No congestion.     Mouth/Throat:     Mouth: Mucous membranes are moist.     Pharynx: No oropharyngeal exudate or posterior oropharyngeal erythema.  Cardiovascular:     Rate and Rhythm: Normal rate and regular rhythm.     Pulses: Normal pulses.     Heart sounds: Normal heart sounds.  Pulmonary:     Effort: Pulmonary effort is normal. No respiratory distress.     Breath sounds: Normal breath sounds. No rhonchi.  Abdominal:     General: Bowel sounds are normal.     Palpations: Abdomen is soft.     Tenderness: There is no abdominal tenderness.     Hernia: No hernia is present.  Musculoskeletal: Normal range of motion.  Skin:    Capillary Refill: Capillary refill takes less than 2 seconds.  Neurological:     Mental Status: He is alert.      UC Treatments / Results  Labs (all labs ordered are listed, but only abnormal results are displayed) Labs Reviewed - No data to display  EKG   Radiology No results found.  Procedures Procedures (including critical care time)  Medications Ordered in UC Medications - No data to display  Initial  Impression / Assessment and Plan / UC Course  I have reviewed the triage vital signs and the nursing notes.  Pertinent labs & imaging results that were available during my care of the patient were reviewed by me and considered in my medical decision making (see chart for details).     1.  Upper respiratory infection likely viral:  COVID-19 test sent Patient is advised to self isolate until COVID-19 testing results are available If patient develops any worsening symptoms he is advised to return to urgent care to be reevaluated. Final Clinical Impressions(s) / UC Diagnoses   Final diagnoses:  Cough   Discharge Instructions   None    ED Prescriptions    None     PDMP not reviewed this encounter.   Chase Picket, MD 11/07/19 2237

## 2019-11-07 LAB — NOVEL CORONAVIRUS, NAA (HOSP ORDER, SEND-OUT TO REF LAB; TAT 18-24 HRS): SARS-CoV-2, NAA: NOT DETECTED

## 2019-11-09 ENCOUNTER — Other Ambulatory Visit: Payer: Self-pay

## 2019-11-09 ENCOUNTER — Telehealth (INDEPENDENT_AMBULATORY_CARE_PROVIDER_SITE_OTHER): Payer: Self-pay | Admitting: Family Medicine

## 2019-11-09 DIAGNOSIS — R05 Cough: Secondary | ICD-10-CM

## 2019-11-09 DIAGNOSIS — R059 Cough, unspecified: Secondary | ICD-10-CM

## 2019-11-09 NOTE — Progress Notes (Signed)
[facility] Telemedicine Visit  Patient consented to have virtual visit. Method of visit: Video was attempted, but technology challenges prevented patient from using video, so visit was conducted via telephone.  Encounter participants: Patient: Jose Cummings - located at home Provider: Bonnita Hollow - located at office Others (if applicable): None  Chief Complaint: Cough  HPI:  Patient here to follow-up for cough that she had intermittently for a few days.  Patient had called to schedule this appointment today, however got concerned when had went to urgent care for evaluation.  He also had other URI type symptoms including nasal congestion.  Patient had negative coronavirus testing then.  Patient reports complete resolution of cough and nasal congestion.  Denies any shortness of breath or fevers.  ROS: per HPI  Pertinent PMHx: Recent URI  Exam:  Respiratory: Able to speak full sentences without issue  Assessment/Plan: Viral URI Patient with symptoms of viral URI.  Reports resolution of symptoms.  Recent negative COVID-19 testing.  Recommend patient continue self quarantine for 10 days as long as he remains afebrile.  Recommend continued supportive care as needed.  Patient to follow-up as needed.   Time spent during visit with patient: 6 minutes

## 2019-12-22 ENCOUNTER — Ambulatory Visit
Admission: EM | Admit: 2019-12-22 | Discharge: 2019-12-22 | Disposition: A | Discharge status: Home / Self Care | Payer: Medicaid Other | Attending: Physician Assistant | Admitting: Physician Assistant

## 2019-12-22 DIAGNOSIS — R519 Headache, unspecified: Secondary | ICD-10-CM

## 2019-12-22 DIAGNOSIS — Z20822 Contact with and (suspected) exposure to covid-19: Secondary | ICD-10-CM

## 2019-12-22 NOTE — ED Triage Notes (Signed)
Pt c/o headache and slight cough since last night. States positive COVID exposure 12/31

## 2019-12-22 NOTE — Discharge Instructions (Signed)
COVID PCR testing ordered. I would like you to quarantine until testing results. You can take over the counter flonase/nasacort to help with nasal congestion/drainage. Tylenol/motrin for pain/fever. If experiencing shortness of breath, trouble breathing, go to the emergency department for further evaluation needed.

## 2019-12-22 NOTE — ED Provider Notes (Signed)
[facility]    CSN: [id number] Arrival date & time: 12/22/19  1253      History   Chief Complaint Chief Complaint  Patient presents with  . covid exposure    HPI Jose Cummings is a 29 y.o. male.   29 year old male comes in for 2 day of URI symptoms, exposure on 12/31. Has had intermittent cough, headache. Headache is to the frontal region, with pain during eye movement. Denies rhinorrhea, nasal congestion, sore throat. Denies fever, chills, body aches. Diarrhea without melena, hematochezia. Denies abdominal pain, nausea, vomiting. Denies shortness of breath, loss of taste/smell. Former smoker.      History reviewed. No pertinent past medical history.  Patient Active Problem List   Diagnosis Date Noted  . Tobacco abuse 08/19/2019  . Alcohol consumption four to six days per week 08/19/2019  . Chest pain 09/22/2014    Past Surgical History:  Procedure Laterality Date  . KNEE SURGERY  2010   right knee       Home Medications    Prior to Admission medications   Not on File    Family History Family History  Problem Relation Age of Onset  . Healthy Mother   . Healthy Father     Social History Social History   Tobacco Use  . Smoking status: Former Smoker    Packs/day: 0.10    Types: Cigarettes  . Smokeless tobacco: Never Used  Substance Use Topics  . Alcohol use: Yes    Comment: occasionally  . Drug use: Yes    Types: Marijuana     Allergies   Patient has no known allergies.   Review of Systems Review of Systems  Reason unable to perform ROS: See HPI as above.     Physical Exam Triage Vital Signs ED Triage Vitals  Enc Vitals Group     BP      Pulse      Resp      Temp      Temp src      SpO2      Weight      Height      Head Circumference      Peak Flow      Pain Score      Pain Loc      Pain Edu?      Excl. in GC?    No data found.  Updated Vital Signs BP 139/80 (BP Location: Left Arm)   Pulse 95   Temp  99.5 F (37.5 C) (Oral)   Resp 18   SpO2 94%   Physical Exam Constitutional:      General: He is not in acute distress.    Appearance: Normal appearance. He is not ill-appearing, toxic-appearing or diaphoretic.  HENT:     Head: Normocephalic and atraumatic.     Nose:     Right Sinus: No maxillary sinus tenderness or frontal sinus tenderness.     Left Sinus: No maxillary sinus tenderness or frontal sinus tenderness.     Mouth/Throat:     Mouth: Mucous membranes are moist.     Pharynx: Oropharynx is clear. Uvula midline.  Cardiovascular:     Rate and Rhythm: Normal rate and regular rhythm.     Heart sounds: Normal heart sounds. No murmur. No friction rub. No gallop.   Pulmonary:     Effort: Pulmonary effort is normal. No accessory muscle usage, prolonged expiration, respiratory distress or retractions.     Comments: Lungs  clear to auscultation without adventitious lung sounds. Musculoskeletal:     Cervical back: Normal range of motion and neck supple.  Neurological:     General: No focal deficit present.     Mental Status: He is alert and oriented to person, place, and time.      UC Treatments / Results  Labs (all labs ordered are listed, but only abnormal results are displayed) Labs Reviewed  NOVEL CORONAVIRUS, NAA    EKG   Radiology No results found.  Procedures Procedures (including critical care time)  Medications Ordered in UC Medications - No data to display  Initial Impression / Assessment and Plan / UC Course  I have reviewed the triage vital signs and the nursing notes.  Pertinent labs & imaging results that were available during my care of the patient were reviewed by me and considered in my medical decision making (see chart for details).    COVID PCR test ordered. Patient to quarantine until testing results return. No alarming signs on exam.  Patient speaking in full sentences without respiratory distress.  Symptomatic treatment discussed.  Push  fluids.  Return precautions given.  Patient expresses understanding and agrees to plan.  Final Clinical Impressions(s) / UC Diagnoses   Final diagnoses:  Close exposure to COVID-19 virus  Sinus headache    ED Prescriptions    None     PDMP not reviewed this encounter.   Ok Edwards, PA-C 12/22/19 1523

## 2019-12-24 LAB — NOVEL CORONAVIRUS, NAA: SARS-CoV-2, NAA: NOT DETECTED

## 2019-12-25 ENCOUNTER — Encounter: Payer: Self-pay | Admitting: Family Medicine

## 2020-02-16 ENCOUNTER — Encounter: Payer: Self-pay | Admitting: Family Medicine

## 2020-02-17 ENCOUNTER — Encounter: Payer: Self-pay | Admitting: Family Medicine

## 2020-02-17 ENCOUNTER — Other Ambulatory Visit: Payer: Self-pay

## 2020-02-17 ENCOUNTER — Telehealth (INDEPENDENT_AMBULATORY_CARE_PROVIDER_SITE_OTHER): Payer: Self-pay | Admitting: Family Medicine

## 2020-02-17 DIAGNOSIS — F419 Anxiety disorder, unspecified: Secondary | ICD-10-CM

## 2020-02-17 DIAGNOSIS — R45851 Suicidal ideations: Secondary | ICD-10-CM

## 2020-02-17 DIAGNOSIS — F329 Major depressive disorder, single episode, unspecified: Secondary | ICD-10-CM

## 2020-02-17 NOTE — Progress Notes (Signed)
[facility] Telemedicine Visit  Patient consented to have virtual visit. Method of visit: Video  Encounter participants: Patient: Jose Cummings - located at Work/outside Provider: Allayne Stack - located at [facility] Others (if applicable): None   Chief Complaint: Anxiety/depression  HPI: Mr. Jose Cummings is a 29 year old gentleman presenting to discuss anxiety/depression.  States this has been an intermittent problem for several years however has not sought help until now, now has noted increased worry over the past several weeks. Notices increased rise due to the Covid pandemic, his career, and his wife's mother passing away recently, generally has felt overwhelmed.  He reports having suicidal thoughts for the past several years intermittently.  Reports he has never made a plan, just generally thinks if he passed away would anyone care.  No previous attempts.  No increase in the frequency of these thoughts recently, states maybe he has had these thoughts twice in the past month or so.  Happily married with 2 children, aged 2 and 87.  Occasionally has brief episodes where his body feels warm with heart racing, but has not had that recently.  PHQ-9 score 7, with thoughts of hurting himself or others several days  GAD-7 score 8, not difficult at all to manage (but reports this is his predominant concern)   ROS: per HPI  Pertinent PMHx: Tobacco and alcohol use   Exam:  General: NAD, walking around outside Respiratory: Unlabored breathing, speaking in full sentences  Assessment/Plan:  Anxiety and depression Chronic, worsening in the setting of Covid pandemic and recent family loss. Chronic passive suicidal ideations without plan or previous attempts.  Through shared decision making, decided to proceed with counseling first, provided resources and crisis hotline number through [employer].  Discussed stress reduction techniques and encouraged staying active.  Will have him  follow-up in 2-3 weeks to see how he is doing, could consider medication at that time if desired.    Case precepted with Dr. Shawnee Knapp.   Time spent during visit with patient: 17 minutes  Allayne Stack, DO

## 2020-02-18 DIAGNOSIS — F419 Anxiety disorder, unspecified: Secondary | ICD-10-CM | POA: Insufficient documentation

## 2020-02-18 DIAGNOSIS — F329 Major depressive disorder, single episode, unspecified: Secondary | ICD-10-CM | POA: Insufficient documentation

## 2020-02-18 NOTE — Assessment & Plan Note (Signed)
Chronic, worsening in the setting of Covid pandemic and recent family loss. Chronic passive suicidal ideations without plan or previous attempts.  Through shared decision making, decided to proceed with counseling first, provided resources and crisis hotline number through [employer].  Discussed stress reduction techniques and encouraged staying active.  Will have him follow-up in 2-3 weeks to see how he is doing, could consider medication at that time if desired.

## 2020-03-23 ENCOUNTER — Encounter: Payer: Self-pay | Admitting: Family Medicine

## 2020-03-23 ENCOUNTER — Ambulatory Visit: Payer: Self-pay

## 2020-03-23 NOTE — Telephone Encounter (Signed)
Patient called stating that he strated to have diarrhea yesterday.  He reports 8 loose stools yesterday and 3 today. He denies vomiting. He states he does not have pain just sensation like you are hungry.Growling sounds. He states it maybe slightly tender on the right side His stomach is soft to touch. He has tried OTC medication for diarrhea. He has no fever or other symptoms. He is voiding normal amounts and drinking plenty of fluids. Per protocol home care advice was read to patient.Patient will seek medical care if symptoms worsen. He was advised to reach out to his PCP. He verbalized understanding of all information.  Reason for Disposition . MILD-MODERATE diarrhea (e.g., 1-6 times / day more than normal)  Answer Assessment - Initial Assessment Questions 1. DIARRHEA SEVERITY: "How bad is the diarrhea?" "How many extra stools have you had in the past 24 hours than normal?"    - NO DIARRHEA (SCALE 0)   - MILD (SCALE 1-3): Few loose or mushy BMs; increase of 1-3 stools over normal daily number of stools; mild increase in ostomy output.   -  MODERATE (SCALE 4-7): Increase of 4-6 stools daily over normal; moderate increase in ostomy output. * SEVERE (SCALE 8-10; OR 'WORST POSSIBLE'): Increase of 7 or more stools daily over normal; moderate increase in ostomy output; incontinence.    3 today 8 times yesterday 2. ONSET: "When did the diarrhea begin?"     Yesterday at 11am 3. BM CONSISTENCY: "How loose or watery is the diarrhea?"      Loose sometimes get watery 4. VOMITING: "Are you also vomiting?" If so, ask: "How many times in the past 24 hours?"      No 5. ABDOMINAL PAIN: "Are you having any abdominal pain?" If yes: "What does it feel like?" (e.g., crampy, dull, intermittent, constant)     Hungry feeling 6. ABDOMINAL PAIN SEVERITY: If present, ask: "How bad is the pain?"  (e.g., Scale 1-10; mild, moderate, or severe)   - MILD (1-3): doesn't interfere with normal activities, abdomen soft and not  tender to touch    - MODERATE (4-7): interferes with normal activities or awakens from sleep, tender to touch    - SEVERE (8-10): excruciating pain, doubled over, unable to do any normal activities       Soft rt side some mild pain 7. ORAL INTAKE: If vomiting, "Have you been able to drink liquids?" "How much fluids have you had in the past 24 hours?"     Fluids yes food not a lot last food cantilope and watermellon 8. HYDRATION: "Any signs of dehydration?" (e.g., dry mouth [not just dry lips], too weak to stand, dizziness, new weight loss) "When did you last urinate?"    none 9. EXPOSURE: "Have you traveled to a foreign country recently?" "Have you been exposed to anyone with diarrhea?" "Could you have eaten any food that was spoiled?"    no 10. ANTIBIOTIC USE: "Are you taking antibiotics now or have you taken antibiotics in the past 2 months?"      none 11. OTHER SYMPTOMS: "Do you have any other symptoms?" (e.g., fever, blood in stool)      no 12. PREGNANCY: "Is there any chance you are pregnant?" "When was your last menstrual period?"       N/A  Protocols used: DIARRHEA-A-AH

## 2020-03-24 ENCOUNTER — Encounter: Payer: Self-pay | Admitting: Family Medicine

## 2020-03-24 ENCOUNTER — Ambulatory Visit (INDEPENDENT_AMBULATORY_CARE_PROVIDER_SITE_OTHER): Payer: Self-pay | Admitting: Family Medicine

## 2020-03-24 ENCOUNTER — Other Ambulatory Visit: Payer: Self-pay

## 2020-03-24 VITALS — BP 108/68 | HR 71 | Ht 78.0 in | Wt 237.4 lb

## 2020-03-24 DIAGNOSIS — F419 Anxiety disorder, unspecified: Secondary | ICD-10-CM

## 2020-03-24 DIAGNOSIS — F329 Major depressive disorder, single episode, unspecified: Secondary | ICD-10-CM

## 2020-03-24 DIAGNOSIS — F32A Depression, unspecified: Secondary | ICD-10-CM

## 2020-03-24 DIAGNOSIS — R197 Diarrhea, unspecified: Secondary | ICD-10-CM

## 2020-03-24 DIAGNOSIS — F410 Panic disorder [episodic paroxysmal anxiety] without agoraphobia: Secondary | ICD-10-CM

## 2020-03-24 DIAGNOSIS — R0789 Other chest pain: Secondary | ICD-10-CM

## 2020-03-24 NOTE — Progress Notes (Signed)
Jose Cummings is alone Sources of clinical information for visit is/are patient and past medical records. Nursing assessment for this office visit was reviewed with the patient for accuracy and revision.   Previous Report(s) Reviewed: historical medical records  Depression screen [facility] 2/9 03/24/2020  Decreased Interest 0  Down, Depressed, Hopeless 1  PHQ - 2 Score 1  Altered sleeping -  Tired, decreased energy -  Change in appetite -  Feeling bad or failure about yourself  -  Trouble concentrating -  Moving slowly or fidgety/restless -  Suicidal thoughts -  PHQ-9 Score -    Fall Risk  03/24/2020  Falls in the past year? 0  Number falls in past yr: 0  Injury with Fall? 0    Adult vaccines due  Topic Date Due  . TETANUS/TDAP  12/25/2024    Health Maintenance Due  Topic Date Due  . HIV Screening  Never done      History/P.E. limitations: none  Adult vaccines due  Topic Date Due  . TETANUS/TDAP  12/25/2024   There are no preventive care reminders to display for this patient.  Health Maintenance Due  Topic Date Due  . HIV Screening  Never done     Chief Complaint  Patient presents with  . Diarrhea    2 days     SUBJECTIVE:   CHIEF COMPLAINT / HPI:   DIARRHEA  Onset: 4-5 days ago  Time course: improving  Description: watery to semiformed nonbloddy BM   Number of stools per day: at max, 4-5  Previous treatment: yes, Kaopectate which helped  Vomiting: no  Abdominal Pain: no  Weight loss: no  Decreased urine output: no  Lightheadedness: no  Recent travel history: no    Sick contacts: no    Suspicious food or water: no  Change in diet: no    Red Flags Fever: no  Bloody stools: no  Recent antibiotics: no  Immuncompromised: no   Anxiety - Acute onset of feeling anxious - No precipitating events - Last minutes - multiple times, occurring abt 1 x wk for several months - Feeling that he is going to die, (+) left anterior axillary pain, (+)  lightheaded - Worries events will occur in public  - No SHOB, No palpitations, no globus, no sweats - denies recreational drug use except marijuana - Hx of alcohol use multiple days per week  CHEST PAIN Onset: episodes of Chest Pain started around September last year with visit to ED where found to not have ACS or PE.  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  He has been avoiding playing pick-up basketball bc of his concern that this pain may be coming from his heart.  Playing basketball is a great pleasure in his life.  He misses it.  Recent Immobility: no  Cancer history: no  Tearing/radiation to back: no   Depression screening score high - See PHQ9 above - Patient previously evaluated by Dr Annia Friendly (02/18/20) for  passive suicidal ideation. Patient has not contacted couseling resources provided that visit - Patient relates much of his symptoms came with pandemic restrictions. His self-esteem has decreased.   - He has continued working.  He continues to live with his wife and children who bring him great pleasure.  - He acknowledges that he has had occasional thoughts that he would be better off dead, these thoughts are not strong and do not persist.  He has not thought of how to harm himself and has no plans to do so. - He is not interested in counseling, referral to psychiatry, nor medication at this time. .   OBJECTIVE:   BP 108/68    Pulse 71   Ht 6\' 6"  (1.981 m)   Wt 237 lb 6 oz (107.7 kg)   SpO2 98%   BMI 27.43 kg/m   GEneral: NAD, groomed, social Cardiac: RRR, No M, normal PMI Chest: tenderness in upper lateral to mid left axillar. No obvious deformity. No skin changes Lungs BCTA Abd: soft, nt, nd, (+) BS, no HSM Ext: no edema  ASSESSMENT/PLAN:   Anxiety and depression Established problem No significant change in PHQ9 score from 02/18/20 with Dr Bolivar Haw Televisit.  Occasional thoughts of being better off dead without out thoughts/plans of self-harm. He declines referral to psychiatry and declines medication intervention. He was again encouraged to make contact with list of counselors provided to him by Dr Higinio Plan.  He would consider it if he thought he needed it.  Follow up encouraged with PCP should his feeling and thoughts worsen.       Suicide Assessment  Plan: - How specific is the plan: no specific plan - How lethal are the means: N/A - Does the patient have access to the means: N/A - Does the patient have social support: Yes, family and friends  Protective factors (what has kept the patient from self-harm thus far):  Family, feeling of postitive future  Substance use / abuse:  THC only per patient; history of drinking multiple days per week.   Presence of hallucinations / delusions: no  History of SI / Attempts: no  Duration and Intensity of SI:  week  History of prior psychiatric hospitalizations: no  Chart review for additional risk factors (cite chronic pain, insomnia, panic attacks, age, gender, if present): report thoughts of being better off dead (without plan) off and on for several years. history of panic attacks  Coping mechanisms: positive self-talk, interactions with others  How likely are you to act on these thoughts of hurting yourself or ending your life sometime over the next month? NOT LIKELY AT ALL  While he is not currently interested in counseling, referral to  psychiatry, nor medication at this time, he said he would contact us or the suicide hotline if his feelings should change.          Lissa Morales, MD [place]

## 2020-03-24 NOTE — Patient Instructions (Addendum)
It was good meeting you.  I believe that you have had a viral intestinal infections that seems to be going away on its own.  The Kaopectate is a good choice for your diarrhea.   I suggest that you pick up Imodium AD from over the counter at your pharmacy.  Take it as directed on the box for next two days.  I recommend staying home for today and tomorrow to prevent the possibility of spreading the virus to others.    Hand washing by you and your family is the best way to avoid giving or getting the virus.    It may be possible that you are experiencing Panic Attacks.  I have attached information about panic attacks for you to read.  See if you think it matches what you are feeling.   Dr Mykaela Arena has made a referral the [facility] to get an exercise stress test.  It is my hope that you will have a good exercise stress test result, then return to the courts.     Panic Attack  A panic attack is when you suddenly feel very afraid, uncomfortable, or nervous (anxious). A panic attack can happen when you are scared or for no reason. A panic attack can feel like a serious problem. It can even feel like a heart attack or stroke. See your doctor when you have a panic attack to make sure you do not have a serious problem. Follow these instructions at home:  Take medicines only as told by your doctor.  If you feel worried or nervous, try not to have caffeine.  Take good care of your health. To do this: ? Eat healthy. Make sure to eat fresh fruits and vegetables, whole grains, lean meats, and low-fat dairy. ? Get enough sleep. Try to sleep for 7-8 hours each night. ? Exercise. Try to be active for 30 minutes 5 or more days a week. ? Do not smoke. Talk to your doctor if you need help quitting. ? Limit how much alcohol you drink:  If you are a woman who is not pregnant: try not to have more than 1 drink a day.  If you are a man: try not to have more than 2 drinks a day.  One drink  equals 12 oz of beer, 5 oz of wine, or 1 oz of hard liquor.  Keep all follow-up visits as told by your doctor. This is important. Contact a doctor if:  Your symptoms do not get better.  Your symptoms get worse.  You are not able to take your medicines as told. Get help right away if:  You have thoughts of hurting yourself or others.  You have symptoms of a panic attack. Do not drive yourself to the hospital. Have someone else drive you or call an ambulance. If you feel like you may hurt yourself or others, or have thoughts about taking your own life, get help right away. You can go to your nearest emergency department or call:  Your local emergency services (911 in the [place]  A suicide crisis helpline, such as the National Suicide Prevention Lifeline at (605)258-4577. This is open 24 hours a day. Summary  A panic attack is when you suddenly feel very afraid, uncomfortable, or nervous (anxious).  See your doctor when you have a panic attack to make sure that you do not have another serious problem.  If you feel like you may hurt yourself or others, get help right away by calling  911. This information is not intended to replace advice given to you by your health care provider. Make sure you discuss any questions you have with your health care provider. Document Revised: 11/15/2017 Document Reviewed: 01/16/2017 Elsevier Patient Education  2020 [employer].

## 2020-03-28 ENCOUNTER — Encounter: Payer: Self-pay | Admitting: Family Medicine

## 2020-03-28 DIAGNOSIS — R197 Diarrhea, unspecified: Secondary | ICD-10-CM | POA: Insufficient documentation

## 2020-03-28 HISTORY — DX: Diarrhea, unspecified: R19.7

## 2020-03-28 NOTE — Assessment & Plan Note (Signed)
Acute illness without systemic symptoms Improving clinically Continue Kaopectate Start Imodium AD next 2-3 days.  If does not resolve or worsens, contact office.

## 2020-03-28 NOTE — Assessment & Plan Note (Addendum)
Established problem No significant change in PHQ9 score from 02/18/20 with Dr Grayling Congress.  Occasional thoughts of being better off dead without out thoughts/plans of self-harm. He declines referral to psychiatry and declines medication intervention. He was again encouraged to make contact with list of counselors provided to him by Dr Annia Friendly.  He would consider it if he thought he needed it.   Patient with brief, self-resolving episodes of intense anxiety that may represent panic attacks.  Discussed possible concern with patient and provided Pt Ed material about Panic Attacks to see if he believes the description matches his experiences.  If he believes there is similarity and he wishes to discuss with clinician, I recommended he contact our office for a visit.   Follow up encouraged with PCP should his feeling and thoughts worsen.

## 2020-03-28 NOTE — Assessment & Plan Note (Signed)
Established problem. 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  He would like to resume playing, but does not because of fear that chest pain is from his heart.  He is requesting a cardiac stress test to see if the chest pain is from his heart.  He believes that a stress test showing low risk results would reassure him such that he could return to his sport.   Referral made to [facility] for consideration of ETT.

## 2020-04-05 ENCOUNTER — Encounter: Payer: Self-pay | Admitting: Cardiology

## 2020-04-05 ENCOUNTER — Other Ambulatory Visit: Payer: Self-pay

## 2020-04-05 ENCOUNTER — Telehealth: Payer: Self-pay | Admitting: *Deleted

## 2020-04-05 ENCOUNTER — Ambulatory Visit (INDEPENDENT_AMBULATORY_CARE_PROVIDER_SITE_OTHER): Payer: Self-pay | Admitting: Cardiology

## 2020-04-05 VITALS — BP 128/80 | HR 61 | Ht 78.0 in | Wt 243.0 lb

## 2020-04-05 DIAGNOSIS — R079 Chest pain, unspecified: Secondary | ICD-10-CM

## 2020-04-05 DIAGNOSIS — R072 Precordial pain: Secondary | ICD-10-CM

## 2020-04-05 DIAGNOSIS — R002 Palpitations: Secondary | ICD-10-CM

## 2020-04-05 MED ORDER — METOPROLOL TARTRATE 50 MG PO TABS: 50.0000 mg | ORAL_TABLET | Freq: Once | ORAL | 0 refills | Status: DC

## 2020-04-05 NOTE — Progress Notes (Signed)
Cardiology Consult  Note    Date:  04/05/2020   ID:  Jose Cummings, DOB 24-Jan-1991, MRN [id number]  PCP:  Jose Sires, DO  Cardiologist:  Jose Him, MD   Chief Complaint  Patient presents with  . New Patient (Initial Visit)    chest pain    History of Present Illness:  Jose Cummings is a 29 y.o. male who is being seen today for the evaluation of chest pain at the request of McDiarmid, Blane Ohara, MD.  This is a 29yo AAM with no prior past medical hx except knee surgery who is referred for evaluation of chest pain.  He tells me that he has been having a pain in the left axilla as well as over his left breast.  The pain is both exertional and nonexertional.  It is associated with diaphoresis but he thinks he gets sweating with a warm flush over Cummings because he gets very anxious when the pain comes on.  There is no associated SOB or nausea with the pain.  The pain occurs about 2 times weekly.  There is no SOB, DOE, PND, orthopnea, LE edema or syncope.  He socially drinks beer and tobacco.  There is no family hx of CAD although he is estranged from his father.     No past medical history on file.  Past Surgical History:  Procedure Laterality Date  . KNEE SURGERY  2010   right knee    Current Medications: No outpatient medications have been marked as taking for the 04/05/20 encounter (Office Visit) with Sueanne Margarita, MD.    Allergies:   Patient has no known allergies.   Social History   Socioeconomic History  . Marital status: Married    Spouse name: Not on file  . Number of children: Not on file  . Years of education: Not on file  . Highest education level: Not on file  Occupational History  . Not on file  Tobacco Use  . Smoking status: Former Smoker    Packs/day: 0.10    Types: Cigarettes  . Smokeless tobacco: Never Used  Substance and Sexual Activity  . Alcohol use: Yes    Comment: occasionally  . Drug use: Yes    Types: Marijuana  . Sexual activity: Not on  file  Other Topics Concern  . Not on file  Social History Narrative  . Not on file   Social Determinants of Health   Financial Resource Strain:   . Difficulty of Paying Living Expenses:   Food Insecurity:   . Worried About Charity fundraiser in the Last Year:   . Arboriculturist in the Last Year:   Transportation Needs:   . Film/video editor (Medical):   Marland Kitchen Lack of Transportation (Non-Medical):   Physical Activity:   . Days of Exercise per Week:   . Minutes of Exercise per Session:   Stress:   . Feeling of Stress :   Social Connections:   . Frequency of Communication with Friends and Family:   . Frequency of Social Gatherings with Friends and Family:   . Attends Religious Services:   . Active Member of Clubs or Organizations:   . Attends Archivist Meetings:   Marland Kitchen Marital Status:      Family History:  The patient's family history includes Healthy in his father and mother.   ROS:   Please see the history of present illness.    ROS All other systems reviewed and  are negative.  No flowsheet data found.   PHYSICAL EXAM:   VS:  BP 128/80   Pulse 61   Ht 6\' 6"  (1.981 m)   Wt 243 lb (110.2 kg)   BMI 28.08 kg/m    GEN: Well nourished, well developed, in no acute distress  HEENT: normal  Neck: no JVD, carotid bruits, or masses Cardiac: RRR; no murmurs, rubs, or gallops,no edema.  Intact distal pulses bilaterally.  Respiratory:  clear to auscultation bilaterally, normal work of breathing GI: soft, nontender, nondistended, + BS MS: no deformity or atrophy  Skin: warm and dry, no rash Neuro:  Alert and Oriented x 3, Strength and sensation are intact Psych: euthymic mood, full affect  Wt Readings from Last 3 Encounters:  04/05/20 243 lb (110.2 kg)  03/24/20 237 lb 6 oz (107.7 kg)  11/05/19 238 lb (108 kg)      Studies/Labs Reviewed:   EKG:  EKG is ordered today.  The ekg ordered today demonstrates NSR with LVH and T wave inversions in the inferior  leads.   Recent Labs: No results found for requested labs within last 8760 hours.   Lipid Panel No results found for: CHOL, TRIG, HDL, CHOLHDL, VLDL, LDLCALC, LDLDIRECT  Additional studies/ records that were reviewed today include:  Office noted from PCP    ASSESSMENT:    1. Chest pain, unspecified type   3. Palpitations      PLAN:  In order of problems listed above:  1. Chest pain -his pain has typical and atypical components.  He tells me that he is not sure if he is having irregular heart beats that makes if feel like pain -his EKG is abnormal with LVH and T wave inversions in the inferior leads with no hx of HTN -I will get a 2D echo to assess for LVH, wall motion and EF -Although unlikely to have significant CAD, he does have an abnormal EKG and need to rule out anomalous coronary artery so will order coronary CTA.  2.  Palpitations -it is difficult for Cummings to describe his sx but he says he cannot tell if this is actually pain or irregular heart beat -I will get an event monitor to assess for arrhythmias    Medication Adjustments/Labs and Tests Ordered: Current medicines are reviewed at length with the patient today.  Concerns regarding medicines are outlined above.  Medication changes, Labs and Tests ordered today are listed in the Patient Instructions below.  Patient Instructions  Medication Instructions:  Your physician recommends that you continue on your current medications as directed. Please refer to the Current Medication list given to you today.  *If you need a refill on your cardiac medications before your next appointment, please call your pharmacy*   Testing/Procedures: Your physician has requested that you have an echocardiogram. Echocardiography is a painless test that uses sound waves to create images of your heart. It provides your doctor with information about the size and shape of your heart and how well your heart's chambers and valves are  working. This procedure takes approximately one hour. There are no restrictions for this procedure.  Your physician has recommended that you wear an event monitor. Event monitors are medical devices that record the heart's electrical activity. Doctors most often 11/07/19 these monitors to diagnose arrhythmias. Arrhythmias are problems with the speed or rhythm of the heartbeat. The monitor is a small, portable device. You can wear one while you do your normal daily activities. This is usually used  to diagnose what is causing palpitations/syncope (passing out).   Follow-Up: At [facility], you and your health needs are our priority.  As part of our continuing mission to provide you with exceptional heart care, we have created designated Provider Care Teams.  These Care Teams include your primary Cardiologist (physician) and Advanced Practice Providers (APPs -  Physician Assistants and Nurse Practitioners) who all work together to provide you with the care you need, when you need it.  Follow up as needed with Dr. Mayford Knife based on results from testing.     Other Instructions Your cardiac CT will be scheduled at:   [facility] [address] [place], [place] [address] [phone]  Please arrive at the [facility] main entrance of [facility] 30 minutes prior to test start time. Proceed to the [facility] Radiology Department (first floor) to check-in and test prep.  Please follow these instructions carefully (unless otherwise directed):  Hold all erectile dysfunction medications at least 3 days (72 hrs) prior to test.  On the Night Before the Test: . Be sure to Drink plenty of water. . Do not consume any caffeinated/decaffeinated beverages or chocolate 12 hours prior to your test. . Do not take any antihistamines 12 hours prior to your test.  On the Day of the Test: . Drink plenty of water. Do not drink any water within one hour of the test. . Do not eat any food 4 hours  prior to the test. . You may take your regular medications prior to the test.  . Take metoprolol (Lopressor) two hours prior to test.  After the Test: . Drink plenty of water. . After receiving IV contrast, you may experience a mild flushed feeling. This is normal. . On occasion, you may experience a mild rash up to 24 hours after the test. This is not dangerous. If this occurs, you can take Benadryl 25 mg and increase your fluid intake. . If you experience trouble breathing, this can be serious. If it is severe call 911 IMMEDIATELY. If it is mild, please call our office.  Once we have confirmed authorization from your insurance company, we will call you to set up a date and time for your test.   For non-scheduling related questions, please contact the cardiac imaging nurse navigator should you have any questions/concerns: Rockwell Alexandria, RN Navigator Cardiac Imaging [facility] [phone] office  For scheduling needs, including cancellations and rescheduling, please call [phone].      Signed, Armanda Magic, MD  04/05/2020 4:32 PM    [facility] [address] [place], [place], [place]  [address] Phone: [phone]; Fax: [phone]

## 2020-04-05 NOTE — Patient Instructions (Addendum)
Medication Instructions:  Your physician recommends that you continue on your current medications as directed. Please refer to the Current Medication list given to you today.  *If you need a refill on your cardiac medications before your next appointment, please call your pharmacy*   Testing/Procedures: Your physician has requested that you have an echocardiogram. Echocardiography is a painless test that uses sound waves to create images of your heart. It provides your doctor with information about the size and shape of your heart and how well your heart's chambers and valves are working. This procedure takes approximately one hour. There are no restrictions for this procedure.  Your physician has recommended that you wear an event monitor. Event monitors are medical devices that record the heart's electrical activity. Doctors most often [place] these monitors to diagnose arrhythmias. Arrhythmias are problems with the speed or rhythm of the heartbeat. The monitor is a small, portable device. You can wear one while you do your normal daily activities. This is usually used to diagnose what is causing palpitations/syncope (passing out).   Follow-Up: At [facility], you and your health needs are our priority.  As part of our continuing mission to provide you with exceptional heart care, we have created designated Provider Care Teams.  These Care Teams include your primary Cardiologist (physician) and Advanced Practice Providers (APPs -  Physician Assistants and Nurse Practitioners) who all work together to provide you with the care you need, when you need it.  Follow up as needed with Dr. Mayford Knife based on results from testing.     Other Instructions Your cardiac CT will be scheduled at:   [facility] [address] [place], [place] [address] [phone]  Please arrive at the [facility] main entrance of [facility] 30 minutes prior to test start time. Proceed to the [facility] Radiology Department (first floor) to check-in and test prep.  Please follow these instructions carefully (unless otherwise directed):  Hold all erectile dysfunction medications at least 3 days (72 hrs) prior to test.  On the Night Before the Test: . Be sure to Drink plenty of water. . Do not consume any caffeinated/decaffeinated beverages or chocolate 12 hours prior to your test. . Do not take any antihistamines 12 hours prior to your test.  On the Day of the Test: . Drink plenty of water. Do not drink any water within one hour of the test. . Do not eat any food 4 hours prior to the test. . You may take your regular medications prior to the test.  . Take metoprolol (Lopressor) two hours prior to test.  After the Test: . Drink plenty of water. . After receiving IV contrast, you may experience a mild flushed feeling. This is normal. . On occasion, you may experience a mild rash up to 24 hours after the test. This is not dangerous. If this occurs, you can take Benadryl 25 mg and increase your fluid intake. . If you experience trouble breathing, this can be serious. If it is severe call 911 IMMEDIATELY. If it is mild, please call our office.  Once we have confirmed authorization from your insurance company, we will call you to set up a date and time for your test.   For non-scheduling related questions, please contact the cardiac imaging nurse navigator should you have any questions/concerns: Rockwell Alexandria, RN Navigator Cardiac Imaging [facility] [phone] office  For scheduling needs, including cancellations and rescheduling, please call [phone].

## 2020-04-05 NOTE — Telephone Encounter (Signed)
Patient enrolled for [employer] to ship 30 day cardiac event monitor to his home.  Patient given instructions to apply for self pay discount. Patient street address and zip code incorrect in Epic demographics .   Corrections made.

## 2020-04-20 ENCOUNTER — Other Ambulatory Visit: Payer: Self-pay

## 2020-04-20 ENCOUNTER — Ambulatory Visit (HOSPITAL_COMMUNITY): Payer: BLUE CROSS/BLUE SHIELD | Attending: Cardiology

## 2020-04-20 DIAGNOSIS — R079 Chest pain, unspecified: Secondary | ICD-10-CM | POA: Diagnosis not present

## 2020-04-20 DIAGNOSIS — R072 Precordial pain: Secondary | ICD-10-CM | POA: Insufficient documentation

## 2020-04-25 ENCOUNTER — Telehealth (HOSPITAL_COMMUNITY): Payer: Self-pay | Admitting: *Deleted

## 2020-04-25 NOTE — Telephone Encounter (Signed)
Reached out to pt to go over instructions about his scheduled Cardiac CT scan.  Pt states that he would to reschedule due to insurance issues.  Pt states that he will get his metoprolol filled but will not take it until the date of his re-scheduled scan. Scheduler made aware.

## 2020-04-26 ENCOUNTER — Ambulatory Visit (HOSPITAL_COMMUNITY): Admission: RE | Admit: 2020-04-26 | Payer: BLUE CROSS/BLUE SHIELD | Source: Ambulatory Visit

## 2020-05-18 ENCOUNTER — Telehealth: Payer: Self-pay

## 2020-05-18 NOTE — Telephone Encounter (Signed)
Received notification from [employer] that the pt has requested cancellation of monitor... Order will be cancelled. 

## 2020-05-25 ENCOUNTER — Telehealth (HOSPITAL_COMMUNITY): Payer: Self-pay | Admitting: *Deleted

## 2020-05-25 ENCOUNTER — Encounter (HOSPITAL_COMMUNITY): Payer: Self-pay

## 2020-05-25 NOTE — Telephone Encounter (Signed)
Reaching out to patient to offer assistance regarding upcoming cardiac imaging study; pt verbalizes understanding of appt date/time, parking situation and where to check in, pre-test NPO status and medications ordered, and verified current allergies; name and call back number provided for further questions should they arise  [facility] [phone] office [phone] cell  Pt given instructions in [place]. Pharmacy called regarding prescription.

## 2020-05-27 ENCOUNTER — Ambulatory Visit (HOSPITAL_COMMUNITY)
Admission: RE | Admit: 2020-05-27 | Discharge: 2020-05-27 | Disposition: A | Discharge status: Home / Self Care | Payer: BLUE CROSS/BLUE SHIELD | Source: Ambulatory Visit | Attending: Cardiology | Admitting: Cardiology

## 2020-05-27 DIAGNOSIS — R072 Precordial pain: Secondary | ICD-10-CM | POA: Insufficient documentation

## 2020-05-27 MED ORDER — NITROGLYCERIN 0.4 MG SL SUBL
SUBLINGUAL_TABLET | SUBLINGUAL | Status: AC
  Filled 2020-05-27: qty 2

## 2020-05-27 MED ORDER — IOHEXOL 350 MG/ML SOLN
80.0000 mL | Freq: Once | INTRAVENOUS | Status: AC | PRN
  Administered 2020-05-27: 80 mL via INTRAVENOUS

## 2020-05-27 MED ORDER — NITROGLYCERIN 0.4 MG SL SUBL
0.8000 mg | SUBLINGUAL_TABLET | Freq: Once | SUBLINGUAL | Status: AC
  Administered 2020-05-27: 0.8 mg via SUBLINGUAL

## 2020-06-06 ENCOUNTER — Encounter: Payer: Self-pay | Admitting: Family Medicine

## 2020-06-13 ENCOUNTER — Telehealth: Payer: Self-pay

## 2020-06-13 DIAGNOSIS — R079 Chest pain, unspecified: Secondary | ICD-10-CM

## 2020-06-13 DIAGNOSIS — R002 Palpitations: Secondary | ICD-10-CM

## 2020-06-13 NOTE — Telephone Encounter (Signed)
Spoke with the patient in regards to message sent via MyChart. Patient verbalized understanding. Order will be placed for cardiac MRI.

## 2020-06-14 ENCOUNTER — Telehealth: Payer: Self-pay | Admitting: *Deleted

## 2020-06-14 NOTE — Telephone Encounter (Signed)
Spoke with patient regarding appointment for Cardiac MRI---will schedule as soon as we hear from his insurance company regarding the prior authorization

## 2020-06-16 ENCOUNTER — Encounter: Payer: Self-pay | Admitting: *Deleted

## 2020-06-16 NOTE — Telephone Encounter (Signed)
Spoke with patient regarding appointment for Cardiac MRI scheduled 07/20/20 at 11:00 am at [facility]---arrival time is 10:30 am 1st floor admissions office.  Will mail information to patient and it is also in My Chart.  Patient voiced his understanding

## 2020-07-19 ENCOUNTER — Telehealth (HOSPITAL_COMMUNITY): Payer: Self-pay | Admitting: *Deleted

## 2020-07-19 NOTE — Telephone Encounter (Signed)
Reaching out to patient to offer assistance regarding upcoming cardiac imaging study; pt verbalizes understanding of appt date/time, parking situation and where to check in, and verified current allergies; name and call back number provided for further questions should they arise  [facility] [phone] office [phone] cell

## 2020-07-20 ENCOUNTER — Ambulatory Visit (HOSPITAL_COMMUNITY)
Admission: RE | Admit: 2020-07-20 | Discharge: 2020-07-20 | Disposition: A | Discharge status: Home / Self Care | Payer: BLUE CROSS/BLUE SHIELD | Source: Ambulatory Visit | Attending: Cardiology | Admitting: Cardiology

## 2020-07-20 DIAGNOSIS — R079 Chest pain, unspecified: Secondary | ICD-10-CM | POA: Insufficient documentation

## 2020-07-20 DIAGNOSIS — R002 Palpitations: Secondary | ICD-10-CM | POA: Diagnosis present

## 2020-07-20 MED ORDER — GADOBUTROL 1 MMOL/ML IV SOLN
10.0000 mL | Freq: Once | INTRAVENOUS | Status: AC | PRN
  Administered 2020-07-20: 10 mL via INTRAVENOUS

## 2020-07-28 ENCOUNTER — Telehealth: Payer: Self-pay | Admitting: Cardiology

## 2020-07-28 NOTE — Telephone Encounter (Signed)
Jose Cummings is calling requesting his results from his test performed on 07/20/20. Please advise.

## 2020-07-28 NOTE — Telephone Encounter (Signed)
Advised patient that his MRI had not been reviewed but we have requested review of it ASAP and we will call him with results. Patient verbalized understanding.

## 2020-08-18 NOTE — Telephone Encounter (Signed)
Left message for patient to call back  

## 2020-08-19 ENCOUNTER — Telehealth: Payer: Self-pay | Admitting: Cardiology

## 2020-08-19 DIAGNOSIS — R002 Palpitations: Secondary | ICD-10-CM

## 2020-08-19 NOTE — Telephone Encounter (Signed)
Spoke with the patient who states that he has concerns because he has been feeling a jumping/twithcing sensation in the middle of his chest. It occurs at random times during rest and last for about 15 seconds. He denies any chest pain, dizziness, or SOB. He states that it makes him really nervous and he just doesn't feel right. He is very concerned about his heart. I reassured him that his cardiac work-up so far has been normal. However it does not look like we ever got the results from his heart monitor. Patient states that he wore it and shipped it back. I advised him that we will check on those results and get back with him.

## 2020-08-19 NOTE — Telephone Encounter (Signed)
Patient returning call in regards to mychart message.  

## 2020-08-19 NOTE — Telephone Encounter (Signed)
Left message for patient to call back  

## 2020-08-29 ENCOUNTER — Telehealth: Payer: Self-pay | Admitting: *Deleted

## 2020-08-29 NOTE — Telephone Encounter (Signed)
Spoke with the patient and made him aware that we will be reordering his monitor due to it not being set up correctly the first time. I offered for him to come into the office so that we could assist with application. Patient prefers that it be mailed again and he will call if he has any questions or concerns when applying it.

## 2020-08-29 NOTE — Telephone Encounter (Signed)
Yes please rechedule

## 2020-09-16 NOTE — Telephone Encounter (Signed)
Error

## 2020-09-23 IMAGING — MR MR CARD MORPHOLOGY WO/W CM
45 of 48 series · 45 of 48 positions shown · IV contrast (Gadavist)
Comparison: Echo 04/20/20, CCTA 05/27/20

EXAM:
CARDIAC MRI

CLINICAL DATA: Left ventricular hypertrophy
TECHNIQUE: The patient was scanned on a 1.5 Tesla GE magnet. A dedicated
cardiac coil was used. Functional imaging was done using Fiesta
sequences. [DATE], and 4 chamber views were done to assess for RWMA's.
Modified Lazo rule using a short axis stack was used to
calculate an ejection fraction on a dedicated work station using
Circle software. The patient received 10mL GADAVIST GADOBUTROL 1
MMOL/ML IV SOLN. After 10 minutes inversion recovery sequences were
used to assess for infiltration and scar tissue.

[Series 4: t2_haste_db_tra_bh · axial · 8.0mm · 1.41mm/px · 1 of 16 slices shown]
[im 1/16]
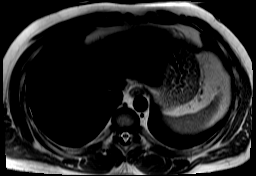

[Series 8: bSSFP · oblique · 8.0mm · 1.61mm/px · 1 of 25 slices shown (1 of 25)]
[im 1/25]
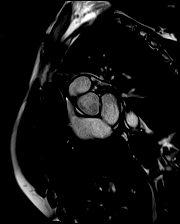

[Series 9: bSSFP · oblique · 8.0mm · 1.61mm/px · 1 of 25 slices shown (2 of 25)]
[im 1/25]
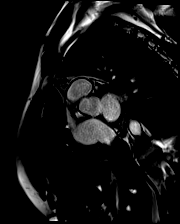

[Series 10: bSSFP · oblique · 8.0mm · 1.61mm/px · 1 of 25 slices shown (3 of 25)]
[im 1/25]
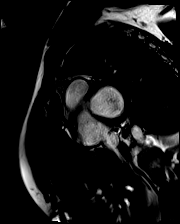

[Series 11: bSSFP · oblique · 8.0mm · 1.61mm/px · 1 of 25 slices shown (4 of 25)]
[im 1/25]
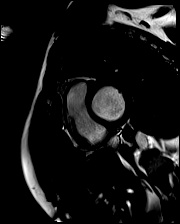

[Series 12: bSSFP · oblique · 8.0mm · 1.61mm/px · 1 of 25 slices shown (5 of 25)]
[im 1/25]
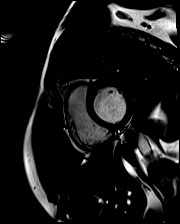

[Series 13: bSSFP · oblique · 8.0mm · 1.61mm/px · 1 of 25 slices shown (6 of 25)]
[im 1/25]
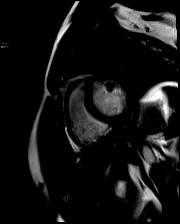

[Series 14: bSSFP · oblique · 8.0mm · 1.61mm/px · 1 of 25 slices shown (7 of 25)]
[im 1/25]
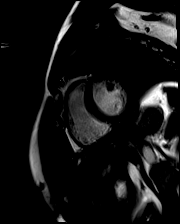

[Series 15: bSSFP · oblique · 8.0mm · 1.61mm/px · 1 of 25 slices shown (8 of 25)]
[im 1/25]
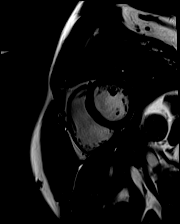

[Series 16: bSSFP · oblique · 8.0mm · 1.61mm/px · 1 of 25 slices shown (9 of 25)]
[im 1/25]
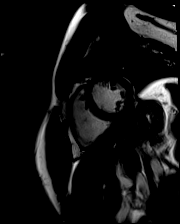

[Series 17: bSSFP · oblique · 8.0mm · 1.61mm/px · 1 of 25 slices shown (10 of 25)]
[im 1/25]
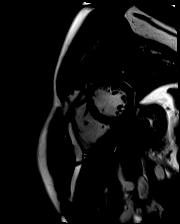

[Series 18: bSSFP · oblique · 8.0mm · 1.61mm/px · 1 of 25 slices shown (11 of 25)]
[im 1/25]
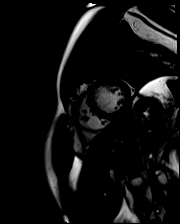

[Series 19: bSSFP · oblique · 8.0mm · 1.61mm/px · 1 of 25 slices shown (12 of 25)]
[im 1/25]
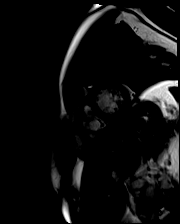

[Series 20: bSSFP · oblique · 8.0mm · 1.61mm/px · 1 of 25 slices shown (13 of 25)]
[im 1/25]
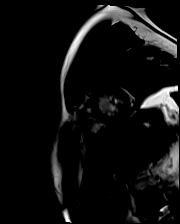

[Series 21: bSSFP · oblique · 8.0mm · 1.61mm/px · 1 of 25 slices shown (14 of 25)]
[im 1/25]
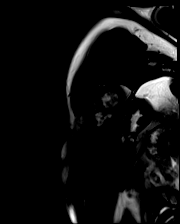

[Series 22: bSSFP · oblique · 8.0mm · 1.61mm/px · 1 of 25 slices shown (15 of 25)]
[im 1/25]
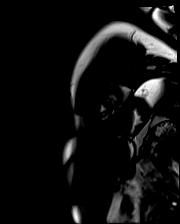

[Series 23: bSSFP · oblique · 8.0mm · 1.61mm/px · 1 of 25 slices shown (16 of 25)]
[im 1/25]
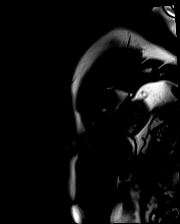

[Series 24: bSSFP · oblique · 8.0mm · 1.61mm/px · 1 of 25 slices shown (17 of 25)]
[im 1/25]
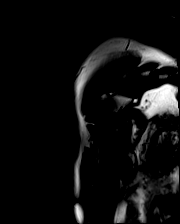

[Series 25: bSSFP · oblique · 8.0mm · 1.61mm/px · 1 of 25 slices shown (18 of 25)]
[im 1/25]
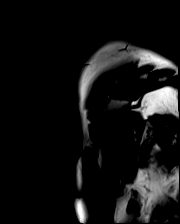

[Series 26: STIR · oblique · 8.0mm · 1.88mm/px · 1 of 17 slices shown]
[im 1/17]
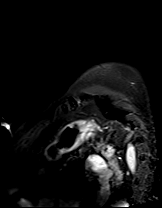

[Series 27: (id)_long_t1 · axial · 8.0mm · 1.41mm/px · 1 of 24 slices shown]
[im 1/24]
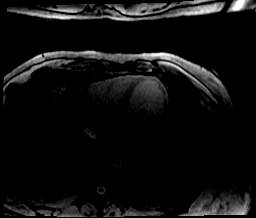

[Series 28: (id)_long_t1_moco · axial · 8.0mm · 1.41mm/px · 1 of 24 slices shown]
[im 1/24]
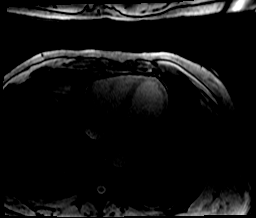

[Series 29: (id)_long_t1_moco_t1 · axial · 8.0mm · 1.41mm/px · 1 of 3 slices shown (1 of 2)]
[im 1/3]
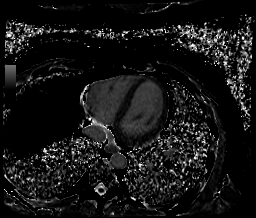

[Series 29: (id)_long_t1_moco_t1 · 1 of 3 slices shown (2 of 2)]
[im 1/3]
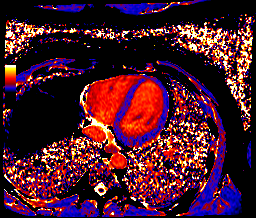

[Series 31: (id)_trufi · axial · 8.0mm · 1.88mm/px · 1 of 9 slices shown]
[im 1/9]
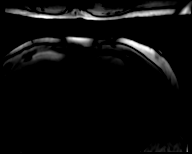

[Series 32: (id)_trufi_moco · axial · 8.0mm · 1.88mm/px · 1 of 9 slices shown]
[im 1/9]
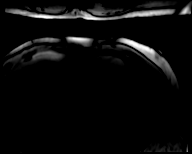

[Series 33: (id)_trufi_moco_t2 · axial · 8.0mm · 1.88mm/px · 1 of 3 slices shown]
[im 1/3]
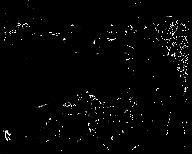

[Series 35: bSSFP · oblique · 6.0mm · 1.41mm/px · 1 of 25 slices shown (19 of 25)]
[im 1/25]
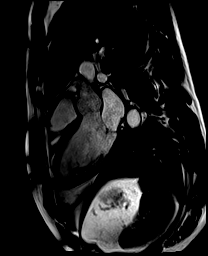

[Series 36: bSSFP · sagittal · 6.0mm · 1.41mm/px · 1 of 25 slices shown (20 of 25)]
[im 1/25]
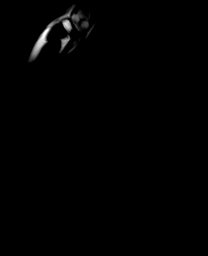

[Series 38: bSSFP · axial · 6.0mm · 1.41mm/px · 1 of 25 slices shown (21 of 25)]
[im 1/25]
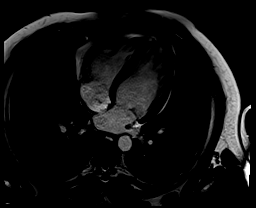

[Series 40: bSSFP · coronal · 6.0mm · 1.41mm/px · 1 of 25 slices shown (22 of 25)]
[im 1/25]
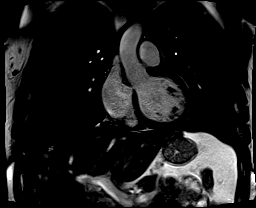

[Series 41: aortic valve cine · axial · 6.0mm · 1.41mm/px · 1 of 25 slices shown]
[im 1/25]
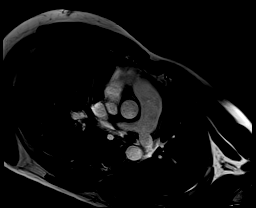

[Series 42: cine rvit · oblique · 6.0mm · 1.41mm/px · 1 of 25 slices shown]
[im 1/25]
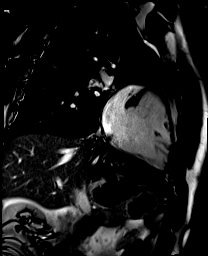

[Series 43: cine rvot · sagittal · 6.0mm · 1.41mm/px · 1 of 25 slices shown]
[im 1/25]
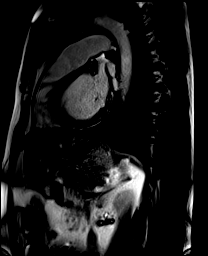

[Series 45: lge_single shot sa · oblique · 8.0mm · 1.98mm/px · 1 of 10 slices shown (1 of 2)]
[im 1/10]
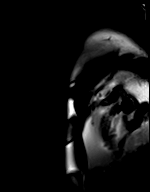

[Series 46: lge_single shot sa · oblique · 8.0mm · 1.98mm/px · 1 of 10 slices shown (2 of 2)]
[im 1/10]
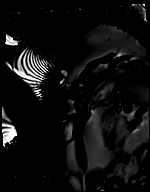

[Series 49: lge_single shot 4 · axial · 6.0mm · 1.98mm/px · 1 of 1 slices shown (1 of 2)]
[im 1/1]
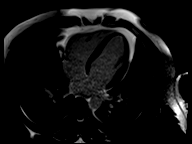

[Series 50: lge_single shot 4 · axial · 6.0mm · 1.98mm/px · 1 of 1 slices shown (2 of 2)]
[im 1/1]
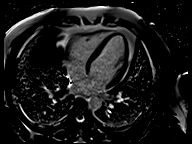

[Series 53: (id)_short_t1 · oblique · 8.0mm · 1.41mm/px · 1 of 27 slices shown]
[im 1/27]
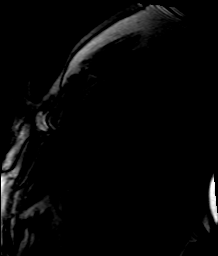

[Series 54: (id)_short_t1_moco · oblique · 8.0mm · 1.41mm/px · 1 of 27 slices shown]
[im 1/27]
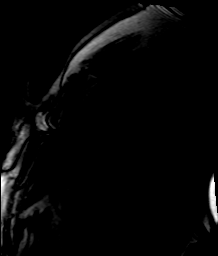

[Series 55: (id)_short_t1_moco_t1 · 1 of 3 slices shown (1 of 2)]
[im 1/3]
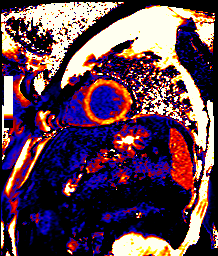

[Series 55: (id)_short_t1_moco_t1 · oblique · 8.0mm · 1.41mm/px · 1 of 3 slices shown (2 of 2)]
[im 1/3]
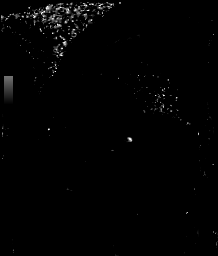

[Series 58: bSSFP · oblique · 6.0mm · 1.73mm/px · 1 of 15 slices shown (23 of 25)]
[im 1/15]
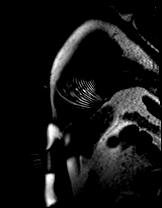

[Series 59: bSSFP · oblique · 6.0mm · 1.73mm/px · 1 of 15 slices shown (24 of 25)]
[im 1/15]
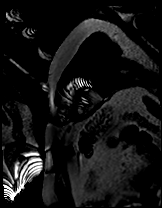

[Series 60: bSSFP · oblique · 6.0mm · 1.73mm/px · 1 of 15 slices shown (25 of 25)]
[im 1/15]
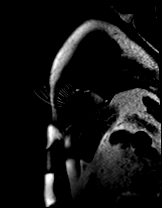

[45 of 48 positions shown; findings below may reference images not displayed]

FINDINGS: LEFT VENTRICLE:

Normal left ventricular chamber size.

Normal left ventricular wall thickness.

Mildly reduced LV systolic function by volume with borderline LVEF.

LVEF = 51%

There are no regional wall motion abnormalities.

There is no post contrast delayed myocardial enhancement.

Normal T1 myocardial nulling kinetics suggest against a diagnosis of
cardiac amyloidosis.

ECV = 22%

RIGHT VENTRICLE:

Upper limit of normal right ventricular chamber size.

Normal right ventricular wall thickness.

Normal right ventricular systolic function.

RVEF = 51%

There are no regional wall motion abnormalities. No akinesis or
dyskinetic segments to suggest arrhythmogenic cardiomyopathy (ARVC).

No post contrast delayed myocardial enhancement.

ATRIA:

Normal left atrial size.

Normal right atrial size.

VALVES:

No significant valvular abnormalities.

PERICARDIUM:

Normal pericardium.  No pericardial effusion.

OTHER: No significant extracardiac findings.

MEASUREMENTS:
Left ventricle:

LV male

LV EF: 51% (normal 56-78%)

Absolute volumes:

LV EDV: 200 mL (Normal 77-195 mL)

LV ESV: 98 mL (Normal 19-72 mL)

LV SV: 101 mL (Normal 51-133 mL)

CO: 5.9 L/min (Normal 2.8-8.8 L/min)

Indexed volumes:

LV EDV: 81 mL/sq-m (Normal 47-92 mL/sq-m)

LV ESV: 40 mL/sq-m (Normal 13-30 mL/sq-m)

LV SV: 41 mL/sq-m (Normal 32-62 mL/sq-m)

CI: 2.4 L/min/sq-m (Normal 1.7-4.2 L/min/sq-m)

Right ventricle:

RV male

RV EF: 51 % (Normal 47-74%)

Absolute volumes:

RV EDV: 220 mL (Normal 88-227 mL)

RV ESV: 107 mL (Normal 23-103 mL)

RV SV: 113 mL (Normal 52-138 mL)

CO: 6.6 L/min (Normal 2.8-8.8 L/min)

Indexed volumes:

RV EDV: 89 mL/sq-m (Normal 55-105 mL/sq-m)

RV ESV: 44 mL/sq-m (Normal 15-43 mL/sq-m)

RV SV: 46 mL/sq-m (Normal 32-64 mL/sq-m)

CI: 2.7 L/min/sq-m (Normal 1.7-4.2 L/min/sq-m)
IMPRESSION: 1. Mildly reduced left ventricular systolic function by volumetric
assessment with low normal left ventricular ejection fraction, LVEF
51%. Normal wall thickness.

2. Normal right ventricular chamber size and systolic function. RVEF
51%.

3. No post contrast delayed myocardial enhancement. No definite
scar, fibrosis, inflammation or infarction seen. Normal ECV.

4.  No MRI findings to explain abnormal ECG.

## 2020-10-12 ENCOUNTER — Encounter: Payer: Self-pay | Admitting: Family Medicine

## 2020-10-13 ENCOUNTER — Other Ambulatory Visit (HOSPITAL_COMMUNITY)
Admission: RE | Admit: 2020-10-13 | Discharge: 2020-10-13 | Disposition: A | Discharge status: Home / Self Care | Payer: Medicaid Other | Source: Ambulatory Visit | Attending: Family Medicine | Admitting: Family Medicine

## 2020-10-13 ENCOUNTER — Encounter: Payer: Self-pay | Admitting: Family Medicine

## 2020-10-13 ENCOUNTER — Other Ambulatory Visit: Payer: Self-pay

## 2020-10-13 ENCOUNTER — Ambulatory Visit (INDEPENDENT_AMBULATORY_CARE_PROVIDER_SITE_OTHER): Payer: Self-pay | Admitting: Family Medicine

## 2020-10-13 VITALS — BP 126/78 | HR 68 | Ht 78.0 in | Wt 242.0 lb

## 2020-10-13 DIAGNOSIS — R350 Frequency of micturition: Secondary | ICD-10-CM

## 2020-10-13 LAB — POCT URINALYSIS DIP (MANUAL ENTRY)
Bilirubin, UA: NEGATIVE
Blood, UA: NEGATIVE
Glucose, UA: NEGATIVE mg/dL
Ketones, POC UA: NEGATIVE mg/dL
Leukocytes, UA: NEGATIVE
Nitrite, UA: NEGATIVE
Protein Ur, POC: NEGATIVE mg/dL
Spec Grav, UA: 1.025 (ref 1.010–1.025)
Urobilinogen, UA: 0.2 E.U./dL
pH, UA: 6 (ref 5.0–8.0)

## 2020-10-13 NOTE — Patient Instructions (Signed)
It was wonderful to see you today.  Fortunately your urine looks clean, we will follow-up with additional urine test.  Make sure that you are drinking this amount of water on a consistent basis.  You can start keeping a journal of when you feel any belly cramping and associate this with any food.  I would recommend making sure that you are having frequent bowel movements, you can always try MiraLAX, to ensure that this is not contributing to your urinary problems at all.  If this is persistent in the next 1-2 weeks or getting worse including any fever, testicular pain/swelling, burning, blood in your urine please make sure you follow-up.

## 2020-10-13 NOTE — Progress Notes (Signed)
    SUBJECTIVE:   CHIEF COMPLAINT / HPI: Urinary frequency  Jose Cummings is a 29 year old gentleman presenting to discuss urinary symptoms.  He reports he initially had frequent urination for 1 day about 2 weeks ago that resolved, however it came back for the past 3 days.  Struggling with suprapubic pressure/discomfort, urinary frequency, and urgency.  Denies any associated dysuria.  Did have a "weird feeling " within the right testes that lasted for a few minutes twice but this is resolved.  Denies any consistent testicular pain or swelling.  Sexually active in a monogamous relationship, married and wife is currently 6 months pregnant.  No concern for STDs, reports his wife was recently treated for BV.  No associated fever, back pain, hematuria, rash, discharge, or bulge of tissue.  Regular soft bowel movements with an occasional harder stool.  Drinks a lot of water during the day, more days than others.  No change in detergents or soaps recently.  No history of diabetes, has not had any fatigue, polydipsia.  PERTINENT  PMH / PSH: Alcohol use, tobacco use, anxiety/depression  OBJECTIVE:   BP 126/78   Pulse 68   Ht 6\' 6"  (1.981 m)   Wt 242 lb (109.8 kg)   SpO2 99%   BMI 27.97 kg/m   General: Alert, NAD HEENT: NCAT, MMM Lungs: No increased WOB  Abdomen: soft, mild tenderness to suprapubic region without any rebounding or guarding, non-distended, normoactive BS Msk: Moves all extremities spontaneously  Ext: Warm, dry, 2+ distal pulses Physical Exam Abdominal:     Hernia: There is no hernia in the left inguinal area or right inguinal area.  Genitourinary:    Pubic Area: No rash.      Penis: Normal and uncircumcised.      Testes: Normal. Cremasteric reflex is present.        Right: Mass, tenderness, swelling or testicular hydrocele not present.        Left: Mass, tenderness, swelling or testicular hydrocele not present.     Epididymis:     Right: No tenderness.     Left: No tenderness.     Urinalysis    Component Value Date/Time   BILIRUBINUR negative 10/13/2020 1118   KETONESUR negative 10/13/2020 1118   PROTEINUR negative 10/13/2020 1118   UROBILINOGEN 0.2 10/13/2020 1118   NITRITE Negative 10/13/2020 1118   LEUKOCYTESUR Negative 10/13/2020 1118    ASSESSMENT/PLAN:   Urinary frequency Acute, associated with suprapubic pressure.  Unclear etiology, U/a entirely clear.  Consider complicated UTI however with normal urinalysis and atypical symptoms suspect this is less likely.  Doubt epididymitis with normal testicular exam and low suspicion for acute prostatitis without concurrent fever/dysuria.  Considering urethritis, gc/ch pending, however low suspicion as he is low risk, could possibly be irritative only.  Doubt T2DM, no glucosuria.  Provided reassurance and will follow up on urine cytology.  May try MiraLax in case intermittent constipation contributing.    Set return precautions especially as this may be the initial development, recommended follow-up if persistent in the next 1-2 weeks or sooner if development of fever, testes/worsening abdominal/back pain, fatigue/feel ill, or dysuria.  10/15/2020, DO [facility]

## 2020-10-13 NOTE — Assessment & Plan Note (Addendum)
Acute, associated with suprapubic pressure.  Unclear etiology, U/a entirely clear.  Consider complicated UTI however with normal urinalysis and atypical symptoms suspect this is less likely.  Doubt epididymitis with normal testicular exam and low suspicion for acute prostatitis without concurrent fever/dysuria.  Considering urethritis, gc/ch pending, however low suspicion as he is low risk, could possibly be irritative only.  Doubt T2DM, no glucosuria.  Provided reassurance and will follow up on urine cytology.  May try MiraLax in case intermittent constipation contributing.

## 2020-10-14 LAB — URINE CYTOLOGY ANCILLARY ONLY
Chlamydia: NEGATIVE
Comment: NEGATIVE
Comment: NORMAL
Neisseria Gonorrhea: NEGATIVE

## 2020-10-20 ENCOUNTER — Encounter (INDEPENDENT_AMBULATORY_CARE_PROVIDER_SITE_OTHER): Payer: Self-pay

## 2020-10-20 DIAGNOSIS — R002 Palpitations: Secondary | ICD-10-CM

## 2020-12-25 NOTE — Progress Notes (Signed)
Virtual Visit via Telephone Note   This visit type was conducted due to national recommendations for restrictions regarding the COVID-19 Pandemic (e.g. social distancing) in an effort to limit this patient's exposure and mitigate transmission in our community.  Due to his co-morbid illnesses, this patient is at least at moderate risk for complications without adequate follow up.  This format is felt to be most appropriate for this patient at this time.  All issues noted in this document were discussed and addressed.  A limited physical exam was performed with this format.  Please refer to the patient's chart for his consent to telehealth for [facility].  Date:  12/26/2020   ID:  Jose Cummings, DOB [birth year], MRN [id number] The patient was identified using 2 identifiers.  Patient Location: Home Provider Location: Office  PCP:  Derrel Nip, MD  Cardiologist:  Armanda Magic, MD  Electrophysiologist:  None   Evaluation Performed:  Follow-Up Visit  Chief Complaint:  Palpitations  History of Present Illness:    Jose Cummings is a 30 y.o. male with no prior past medical hx except knee surgery who was initially referred for evaluation of chest pain.  He had been having a pain in the left axilla as well as over his left breast.  The pain was both exertional and nonexertional and associated with diaphoresis but he thought it was sweating with a warm flush over him because he would get very anxious when the pain came on.   Because of abnormal EKG with TWI in inferior leads echo and Coronary CTA were done with 2D echo normal and coronary CTA normal.  Cardiac MRI showed low normal LVF with EF 51% with normal RV and no evidence of LGE on delayed images.   The patient called back in Sept 2021 complaining of a "jumping/twitching" sensation in the middle of his chest that was random in occurrence and lasted about 15 minutes.  Event monitor was done which was normal despite having palpitations  while wearing it.  He is now here to discuss the results of his studies.   The patient does not have symptoms concerning for COVID-19 infection (fever, chills, cough, or new shortness of breath).    Past Medical History:  Diagnosis Date  . Acute diarrhea 03/28/2020   Past Surgical History:  Procedure Laterality Date  . KNEE SURGERY  2010   right knee     No outpatient medications have been marked as taking for the 12/26/20 encounter (Video Visit) with Quintella Reichert, MD.     Allergies:   Patient has no known allergies.   Social History   Tobacco Use  . Smoking status: Former Smoker    Packs/day: 0.10    Types: Cigarettes  . Smokeless tobacco: Never Used  Substance Use Topics  . Alcohol use: Yes    Comment: occasionally  . Drug use: Yes    Types: Marijuana     Family Hx: The patient's family history includes Healthy in his father and mother.  ROS:   Please see the history of present illness.     All other systems reviewed and are negative.   Prior CV studies:   The following studies were reviewed today:  Event monitor 12.8.21 Study Highlights   SInus bradycardia, normal sinus rhythm and sinus tachycardia. The average heart rate was 70bpm and ranged from 52 to 160bpm.   2D echo 04/20/2020 IMPRESSIONS   1. Left ventricular ejection fraction, by estimation, is 55 to 60%. The  left  ventricle has normal function. The left ventricle has no regional  wall motion abnormalities. Left ventricular diastolic parameters were  normal.  2. Right ventricular systolic function is normal. The right ventricular  size is normal. There is normal pulmonary artery systolic pressure.  3. The mitral valve is normal in structure. Trivial mitral valve  regurgitation. No evidence of mitral stenosis.  4. The aortic valve is tricuspid. Aortic valve regurgitation is not  visualized. No aortic stenosis is present.  5. The inferior vena cava is normal in size with greater than 50%   respiratory variability, suggesting right atrial pressure of 3 mmHg.   Conclusion(s)/Recommendation(s): Normal biventricular function without  evidence of hemodynamically significant valvular heart disease.   Coronary CTA 05/27/2020 IMPRESSION: 1. Coronary calcium score of 0. This was 0 percentile for age and sex matched control.  2. Normal coronary origin with right dominance.  3. No evidence of CAD.  CAD-RADS=0.  4. Consider non-atherosclerotic causes of chest pain.  Cardiac MRI 07/20/2020 IMPRESSION: 1. Mildly reduced left ventricular systolic function by volumetric assessment with low normal left ventricular ejection fraction, LVEF 51%. Normal wall thickness.  2. Normal right ventricular chamber size and systolic function. RVEF 51%.  3. No post contrast delayed myocardial enhancement. No definite scar, fibrosis, inflammation or infarction seen. Normal ECV.  4.  No MRI findings to explain abnormal ECG.   Labs/Other Tests and Data Reviewed:    EKG:  No ECG reviewed.  Recent Labs: No results found for requested labs within last 8760 hours.   Recent Lipid Panel No results found for: CHOL, TRIG, HDL, CHOLHDL, LDLCALC, LDLDIRECT  Wt Readings from Last 3 Encounters:  12/26/20 248 lb (112.5 kg)  10/13/20 242 lb (109.8 kg)  04/05/20 243 lb (110.2 kg)      Objective:    Vital Signs:  Ht 6\' 6"  (1.981 m)   Wt 248 lb (112.5 kg)   BMI 28.66 kg/m     ASSESSMENT & PLAN:    1. Chest pain -EKG with nonspecific T wave abnormality in the inferior leads -no evidence of myocarditis/pericarditis on cardiac MRI and normal LVF and RVF>>personally reviewed this visit -normal 2D echo>>personally reviewed this visit -normal coronary CTA>>personally reviewed this visit -chest pain is noncardiac in origin  2.  Palpitations -describes as a sensation of a "jumping/twitching" sensation in the middle of his chest that was random in occurrence and lasted about 15 minutes.   -event monitor was normal>>I personally reviewed all rhythm strips from event monitor with the patient and tried to calm his worries about there being something wrong with his heart.  He had palpitations while wearing the event monitor and nothing showed up on the monitor. -suspect this is anxiety and encouraged him to talk with his PCP   COVID-19 Education: The signs and symptoms of COVID-19 were discussed with the patient and how to seek care for testing (follow up with PCP or arrange E-visit).  The importance of social distancing was discussed today.  Time:   Today, I have spent 20 minutes with the patient with telehealth technology discussing the above problems.   Medication Adjustments/Labs and Tests Ordered: Current medicines are reviewed at length with the patient today.  Concerns regarding medicines are outlined above.   Tests Ordered: No orders of the defined types were placed in this encounter.   Medication Changes: No orders of the defined types were placed in this encounter.   Follow Up:   prn  Signed, , MD  12/26/2020  9:40 AM    [facility]

## 2020-12-26 ENCOUNTER — Telehealth (INDEPENDENT_AMBULATORY_CARE_PROVIDER_SITE_OTHER): Payer: Medicaid Other | Admitting: Cardiology

## 2020-12-26 ENCOUNTER — Other Ambulatory Visit: Payer: Self-pay

## 2020-12-26 VITALS — Ht 78.0 in | Wt 248.0 lb

## 2020-12-26 DIAGNOSIS — R079 Chest pain, unspecified: Secondary | ICD-10-CM

## 2020-12-26 DIAGNOSIS — R002 Palpitations: Secondary | ICD-10-CM

## 2021-04-10 ENCOUNTER — Encounter: Payer: Self-pay | Admitting: Family Medicine

## 2021-06-01 NOTE — Telephone Encounter (Signed)
Left message for patient to call back  

## 2021-06-08 NOTE — Telephone Encounter (Signed)
Called patient as requested. Patient stated he has been having some "jumping" in his left side of chest that happens usually once or twice a week. Informed patient that all his test have come back normal per Dr. Norris Cross note on last virtual visit in January. Encouraged patient to call his PCP about anxiety. Informed patient that a message would be sent to Dr. Mayford Knife for advisement

## 2021-09-01 ENCOUNTER — Encounter: Payer: Self-pay | Admitting: Family Medicine

## 2022-05-22 ENCOUNTER — Encounter: Payer: Self-pay | Admitting: *Deleted

## 2023-10-29 ENCOUNTER — Other Ambulatory Visit: Payer: Self-pay | Admitting: Nurse Practitioner

## 2023-10-29 ENCOUNTER — Ambulatory Visit: Payer: Self-pay

## 2023-10-29 DIAGNOSIS — Z021 Encounter for pre-employment examination: Secondary | ICD-10-CM

## 2025-01-13 ENCOUNTER — Emergency Department (HOSPITAL_BASED_OUTPATIENT_CLINIC_OR_DEPARTMENT_OTHER)
Admission: EM | Admit: 2025-01-13 | Discharge: 2025-01-13 | Disposition: A | Discharge status: Home / Self Care | Attending: Emergency Medicine | Admitting: Emergency Medicine

## 2025-01-13 ENCOUNTER — Other Ambulatory Visit: Payer: Self-pay

## 2025-01-13 ENCOUNTER — Encounter (HOSPITAL_BASED_OUTPATIENT_CLINIC_OR_DEPARTMENT_OTHER): Payer: Self-pay | Admitting: Emergency Medicine

## 2025-01-13 ENCOUNTER — Emergency Department (HOSPITAL_BASED_OUTPATIENT_CLINIC_OR_DEPARTMENT_OTHER): Admitting: Radiology

## 2025-01-13 DIAGNOSIS — S63511A Sprain of carpal joint of right wrist, initial encounter: Secondary | ICD-10-CM | POA: Insufficient documentation

## 2025-01-13 DIAGNOSIS — Y92410 Unspecified street and highway as the place of occurrence of the external cause: Secondary | ICD-10-CM | POA: Diagnosis not present

## 2025-01-13 DIAGNOSIS — S52614A Nondisplaced fracture of right ulna styloid process, initial encounter for closed fracture: Secondary | ICD-10-CM | POA: Insufficient documentation

## 2025-01-13 DIAGNOSIS — S6991XA Unspecified injury of right wrist, hand and finger(s), initial encounter: Secondary | ICD-10-CM | POA: Diagnosis present

## 2025-01-13 DIAGNOSIS — M25561 Pain in right knee: Secondary | ICD-10-CM | POA: Diagnosis not present

## 2025-01-13 DIAGNOSIS — M25562 Pain in left knee: Secondary | ICD-10-CM | POA: Insufficient documentation

## 2025-01-13 MED ORDER — METHOCARBAMOL 500 MG PO TABS: 500.0000 mg | ORAL_TABLET | Freq: Two times a day (BID) | ORAL | 0 refills | Status: AC

## 2025-01-13 MED ORDER — NAPROXEN 500 MG PO TABS: 500.0000 mg | ORAL_TABLET | Freq: Two times a day (BID) | ORAL | 0 refills | Status: AC
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
## 2025-01-13 NOTE — Discharge Instructions (Signed)
 It was a pleasure taking care of you here today.    As we discussed you have a small chip off the bone in your wrist as well as a ligament injury.  We have placed you in a wrist splint.  I for you for some muscle relaxers and anti-inflammatories.  Make sure to follow-up with an orthopedic surgeon there is 1 listed in her discharge paperwork.  Call tomorrow to schedule appointment.
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
